# Patient Record
Sex: Male | Born: 1985 | Race: Black or African American | Hispanic: No | Marital: Single | Smoking: Current every day smoker
Health system: Southern US, Community
[De-identification: ages and names within clinical notes are randomized; demographics above are authoritative.]

## PROBLEM LIST (undated history)

## (undated) HISTORY — PX: OTHER SURGICAL HISTORY: SHX169

## (undated) HISTORY — PX: SHOULDER SURGERY: SHX246

---
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
## 2006-08-18 DIAGNOSIS — F980 Enuresis not due to a substance or known physiological condition: Secondary | ICD-10-CM

## 2006-08-18 DIAGNOSIS — J45909 Unspecified asthma, uncomplicated: Secondary | ICD-10-CM | POA: Insufficient documentation

## 2006-08-18 DIAGNOSIS — L2089 Other atopic dermatitis: Secondary | ICD-10-CM | POA: Insufficient documentation

## 2010-04-10 ENCOUNTER — Emergency Department (HOSPITAL_COMMUNITY): Admission: EM | Admit: 2010-04-10 | Discharge: 2010-04-10 | Payer: Self-pay | Admitting: Family Medicine

## 2010-07-27 ENCOUNTER — Inpatient Hospital Stay (INDEPENDENT_AMBULATORY_CARE_PROVIDER_SITE_OTHER)
Admission: RE | Admit: 2010-07-27 | Discharge: 2010-07-27 | Disposition: A | Discharge status: Home / Self Care | Payer: Self-pay | Source: Ambulatory Visit | Attending: Family Medicine | Admitting: Family Medicine

## 2010-07-27 DIAGNOSIS — R112 Nausea with vomiting, unspecified: Secondary | ICD-10-CM

## 2010-07-27 LAB — POCT I-STAT, CHEM 8
BUN: 16 mg/dL (ref 6–23)
Creatinine, Ser: 1.3 mg/dL (ref 0.4–1.5)
Glucose, Bld: 119 mg/dL — ABNORMAL HIGH (ref 70–99)
Hemoglobin: 16.7 g/dL (ref 13.0–17.0)

## 2010-07-27 LAB — POCT URINALYSIS DIPSTICK
Bilirubin Urine: NEGATIVE
Specific Gravity, Urine: 1.015 (ref 1.005–1.030)
Urine Glucose, Fasting: NEGATIVE mg/dL

## 2010-08-17 ENCOUNTER — Emergency Department (HOSPITAL_COMMUNITY)
Admission: EM | Admit: 2010-08-17 | Discharge: 2010-08-17 | Disposition: A | Discharge status: Home / Self Care | Payer: No Typology Code available for payment source | Attending: Emergency Medicine | Admitting: Emergency Medicine

## 2010-08-17 ENCOUNTER — Emergency Department (HOSPITAL_COMMUNITY): Payer: No Typology Code available for payment source

## 2010-08-17 DIAGNOSIS — M25569 Pain in unspecified knee: Secondary | ICD-10-CM | POA: Insufficient documentation

## 2010-08-17 DIAGNOSIS — J45909 Unspecified asthma, uncomplicated: Secondary | ICD-10-CM | POA: Insufficient documentation

## 2010-08-17 DIAGNOSIS — S8000XA Contusion of unspecified knee, initial encounter: Secondary | ICD-10-CM | POA: Insufficient documentation

## 2010-08-17 DIAGNOSIS — Y929 Unspecified place or not applicable: Secondary | ICD-10-CM | POA: Insufficient documentation
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
## 2011-06-17 ENCOUNTER — Encounter: Payer: Self-pay | Admitting: Emergency Medicine

## 2011-06-17 ENCOUNTER — Emergency Department (INDEPENDENT_AMBULATORY_CARE_PROVIDER_SITE_OTHER)
Admission: EM | Admit: 2011-06-17 | Discharge: 2011-06-17 | Disposition: A | Discharge status: Home / Self Care | Payer: 59 | Source: Home / Self Care | Attending: Emergency Medicine | Admitting: Emergency Medicine

## 2011-06-17 ENCOUNTER — Emergency Department (INDEPENDENT_AMBULATORY_CARE_PROVIDER_SITE_OTHER): Payer: No Typology Code available for payment source

## 2011-06-17 DIAGNOSIS — J111 Influenza due to unidentified influenza virus with other respiratory manifestations: Secondary | ICD-10-CM

## 2011-06-17 DIAGNOSIS — R6889 Other general symptoms and signs: Secondary | ICD-10-CM

## 2011-06-17 MED ORDER — ONDANSETRON 4 MG PO TBDP
8.0000 mg | ORAL_TABLET | Freq: Once | ORAL | Status: AC
Start: 2011-06-17 — End: 2011-06-17
  Administered 2011-06-17: 8 mg via ORAL

## 2011-06-17 MED ORDER — ONDANSETRON 4 MG PO TBDP
ORAL_TABLET | ORAL | Status: AC
  Filled 2011-06-17: qty 2

## 2011-06-17 MED ORDER — GUAIFENESIN-CODEINE 100-10 MG/5ML PO SYRP: 10.0000 mL | ORAL_SOLUTION | Freq: Four times a day (QID) | ORAL | Status: AC | PRN

## 2011-06-17 MED ORDER — TRAMADOL HCL 50 MG PO TABS: 100.0000 mg | ORAL_TABLET | Freq: Three times a day (TID) | ORAL | Status: AC | PRN

## 2011-06-17 MED ORDER — ONDANSETRON 8 MG PO TBDP: 8.0000 mg | ORAL_TABLET | Freq: Three times a day (TID) | ORAL | Status: AC | PRN

## 2011-06-17 NOTE — ED Notes (Signed)
Reports vomiting, abdominal pain .  Denies diarrhea.  Fever, unknown how high.  reports chills.  Patient reports he is holding down liquids, genral body aches.

## 2011-06-17 NOTE — ED Provider Notes (Signed)
History     CSN: [id number]  Arrival date & time 06/17/11  1300   First MD Initiated Contact with Patient 06/17/11 1552      Chief Complaint  Patient presents with  . Emesis    (Consider location/radiation/quality/duration/timing/severity/associated sxs/prior treatment) HPI Comments: Jack Floyd has had a three-day history of nausea and vomiting of solids, although he can keep liquids down. He's also felt feverish, had sweats, chills, generalized crampy abdominal pain, cough productive yellow sputum, sore throat, nasal congestion with bloody drainage, and epistaxis. He has not had the flu vaccine this year.  Patient is a 25 y.o. male presenting with vomiting.  Emesis  Associated symptoms include abdominal pain, chills, cough and a fever. Pertinent negatives include no diarrhea.    Past Medical History  Diagnosis Date  . Asthma     History reviewed. No pertinent past surgical history.  History reviewed. No pertinent family history.  History  Substance Use Topics  . Smoking status: Current Everyday Smoker  . Smokeless tobacco: Not on file  . Alcohol Use: No      Review of Systems  Constitutional: Positive for fever, chills, diaphoresis and fatigue.  HENT: Positive for congestion, sore throat and rhinorrhea. Negative for ear pain, sneezing, neck stiffness, voice change and postnasal drip.   Eyes: Negative for pain, discharge and redness.  Respiratory: Positive for cough and wheezing. Negative for chest tightness and shortness of breath.   Gastrointestinal: Positive for nausea, vomiting and abdominal pain. Negative for diarrhea.  Skin: Negative for rash.    Allergies  Review of patient's allergies indicates no known allergies.  Home Medications   Current Outpatient Rx  Name Route Sig Dispense Refill  . GUAIFENESIN ER 600 MG PO TB12 Oral Take 1,200 mg by mouth 2 (two) times daily.      . IBUPROFEN 200 MG PO TABS Oral Take 200 mg by mouth every 6 (six) hours as needed.       . GUAIFENESIN-CODEINE 100-10 MG/5ML PO SYRP Oral Take 10 mLs by mouth 4 (four) times daily as needed for cough. 120 mL 0  . ONDANSETRON 8 MG PO TBDP Oral Take 1 tablet (8 mg total) by mouth every 8 (eight) hours as needed for nausea. 20 tablet 0  . TRAMADOL HCL 50 MG PO TABS Oral Take 2 tablets (100 mg total) by mouth every 8 (eight) hours as needed for pain. Maximum dose= 8 tablets per day 30 tablet 0    BP 136/86  Pulse 100  Temp(Src) 100 F (37.8 C) (Oral)  Resp 20  SpO2 98%  Physical Exam  Nursing note and vitals reviewed. Constitutional: He appears well-developed and well-nourished. No distress.  HENT:  Head: Normocephalic and atraumatic.  Right Ear: External ear normal.  Left Ear: External ear normal.  Nose: Nose normal.  Mouth/Throat: Oropharynx is clear and moist. No oropharyngeal exudate.  Eyes: Conjunctivae and EOM are normal. Pupils are equal, round, and reactive to light. Right eye exhibits no discharge. Left eye exhibits no discharge.  Neck: Normal range of motion. Neck supple.  Cardiovascular: Normal rate, regular rhythm and normal heart sounds.   Pulmonary/Chest: Effort normal. No stridor. No respiratory distress. He has wheezes (he had scattered expiratory wheezes.). He has no rales. He exhibits no tenderness.  Abdominal: Soft. Bowel sounds are normal. He exhibits no distension and no mass. There is tenderness (he had mild generalized tenderness to palpation without guarding or rebound.). There is no rebound and no guarding.  Lymphadenopathy:  He has no cervical adenopathy.  Skin: Skin is warm and dry. No rash noted. He is not diaphoretic.    ED Course  Procedures (including critical care time)  Labs Reviewed - No data to display Dg Chest 2 View  06/17/2011  *RADIOLOGY REPORT*  Clinical Data: Cough, fever  CHEST - 2 VIEW  Comparison: 12/05/2003  Findings: Lungs are clear. No pleural effusion or pneumothorax.  Cardiomediastinal silhouette is within normal  limits.  Visualized osseous structures are within normal limits.  IMPRESSION: Normal chest radiographs.  Original Report Authenticated By: Charline Bills, M.D.     1. Influenza-like illness       MDM  He has an influenza-like illness and since this is been going on for more than 48 hours, will treat symptomatically with guaifenesin/codeine cough syrup, Zofran, and tramadol.        Roque Lias, MD 06/17/11 [phone]

## 2011-09-22 ENCOUNTER — Emergency Department (INDEPENDENT_AMBULATORY_CARE_PROVIDER_SITE_OTHER)
Admission: EM | Admit: 2011-09-22 | Discharge: 2011-09-22 | Disposition: A | Discharge status: Home / Self Care | Payer: 59 | Source: Home / Self Care | Attending: Emergency Medicine | Admitting: Emergency Medicine

## 2011-09-22 ENCOUNTER — Encounter (HOSPITAL_COMMUNITY): Payer: Self-pay | Admitting: *Deleted

## 2011-09-22 DIAGNOSIS — B9789 Other viral agents as the cause of diseases classified elsewhere: Secondary | ICD-10-CM

## 2011-09-22 DIAGNOSIS — B349 Viral infection, unspecified: Secondary | ICD-10-CM

## 2011-09-22 MED ORDER — GI COCKTAIL ~~LOC~~
30.0000 mL | Freq: Once | ORAL | Status: AC
  Administered 2011-09-22: 30 mL via ORAL

## 2011-09-22 MED ORDER — BENZONATATE 200 MG PO CAPS: 200.0000 mg | ORAL_CAPSULE | Freq: Three times a day (TID) | ORAL | Status: AC | PRN

## 2011-09-22 MED ORDER — DIPHENOXYLATE-ATROPINE 2.5-0.025 MG PO TABS: 1.0000 | ORAL_TABLET | Freq: Four times a day (QID) | ORAL | Status: AC | PRN

## 2011-09-22 MED ORDER — GI COCKTAIL ~~LOC~~
ORAL | Status: AC
  Filled 2011-09-22: qty 30

## 2011-09-22 MED ORDER — FLUTICASONE PROPIONATE 50 MCG/ACT NA SUSP: 2.0000 | Freq: Every day | NASAL | Status: DC

## 2011-09-22 NOTE — Discharge Instructions (Signed)
Most upper respiratory infections are caused by viruses and do not require antibiotics.  We try to save the antibiotics for when we really need them to avoid resistance.  This does not mean that there is nothing that can be done.  Here are a few hints about things that can be done at home to get over an upper respiratory infection quicker:  Get extra sleep and extra fluids.  Get 7 to 9 hours of sleep per night and 6 to 8 glasses of water a day.  Getting extra sleep keeps the immune system from getting run down.  Most people with an upper respiratory infection are a little dehydrated.  The extra fluids also keep the secretions liquified and easier to deal with.  Also, get extra vitamin C.  4000 mg per day is the recommended dose. For the aches, headache, and fever, acetaminophen or ibuprofen are helpful.  These can be alternated every 4 hours.  People with liver disease should avoid large amounts of acetaminophen, and people with ulcer disease, gastroesophageal reflux, gastritis, congestive heart failure, chronic kidney disease, coronary artery disease and the elderly should avoid ibuprofen. For nasal congestion try Mucinex-D, or if you're having lots of sneezing or copious clear nasal drainage Allegra-D-24 hour.  A Saline nasal spray such as Ocean Spray can also help as can decongestant sprays such as Afrin, but you should not use the decongestant sprays for more than 3 or 4 days since they can be habituating.  If nasal dryness is a problem, Ayr Nasal Gel can help moisturize your nasal passages.  Breath Rite nasal strips can also offer a non-drug alternative treatment to nasal congestion, especially at night. For people with symptoms of sinusitis, sleeping with your head elevated can be helpful.  For sinus pain, moist, hot compresses to the face may provide some relief.  Many people find that inhaling steam as in a shower or from a pot of steaming water can help. For sore throat, zinc containing lozenges such  as Cold-Eze or Zicam are helpful.  Zinc helps to fight infection and has a mild astringent effect that relieves the sore, achey throat.  Hot salt water gargles (8 oz of hot water, 1/2 tsp of table salt, and a pinch of baking soda) can give relief as well as hot beverages such as hot tea. For the cough, old time remedies such as honey or honey and lemon are tried and true.  Over the counter cough syrups such as Delsym 2 tsp every 12 hours can help as well.  It's important when you have an upper respiratory infection not to pass the infection to others.  This involves being very careful about the following:  Frequent hand washing or use of hand sanitizer, especially after coughing, sneezing, blowing your nose or touching your face, nose or eyes. Do not shake hands or touch anyone and try to avoid touching surfaces that other people use such as doorknobs, shopping carts, telephones and computer keyboards. Use tissues and dispose of them properly in a garbage can or ziplock bag. Cough into your sleeve. Do not let others eat or drink after you.  It's also important to recognize the signs of serious illness and get evaluated if they occur: Any respiratory infection that lasts more than 7 to 10 days.  Yellow nasal drainage and sputum are not reliable indicators of a bacterial infection, but if they last for more than 1 week, see your doctor. Fever and sore throat can indicate strep. Fever   and cough can indicate influenza or pneumonia. Any kind of severe symptom such as difficulty breathing, intractable vomiting, or severe pain should prompt you to see a doctor as soon as possible.   Your body's immune system is really the thing that will get rid of this infection.  Your immune system is comprised of 2 types of specialized cells called T cells and B cells.  T cells coordinate the array of cells in your body that engulf invading bacteria or viruses while B cells orchestrate the production of antibodies that  neutralize infection.  Anything we do or any medications we give you, will just strengthen your immune system or help it clear up the infection quicker.  Here are a few helpful hints to improve your immune system to help overcome this illness or to prevent future infections:  A few vitamins can improve the health of your immune system.  That's why your diet should include plenty of fruits, vegetables, fish, nuts, and whole grains.  Vitamin A and bet-carotene can increase the cells that fight infections (T cells and B cells).  Vitamin A is abundant in dark greens and orange vegetables such as spinach, greens, sweet potatoes, and carrots.  Vitamin B6 contributes to the maturation of white blood cells, the cells that fight disease.  Foods with vitamin B6 include cold cereal and bananas.  Vitamin C is credited with preventing colds because it increases white blood cells and also prevents cellular damage.  Citrus fruits, peaches and green and red bell peppers are all hight in vitamin C.  Vitamin E is an anti-oxidant that encourages the production of natural killer cells which reject foreign invaders and B cells that produce antibodies.  Foods high in vitamin E include wheat germ, nuts and seeds.  Foods high in omega-3 fatty acids found in foods like salmon, tuna and mackerel boost your immune system and help cells to engulf and absorb germs.  Probiotics are good bacteria that increase your T cells.  These can be found in yogurt and are available in supplements such as Culturelle or Align.  Moderate exercise increases the strength of your immune system and your ability to recover from illness.  I suggest 3 to 5 moderate intensity 30 minute workouts per week.    Sleep is another component of maintaining a strong immune system.  It enables your body to recuperate from the day's activities, stress and work.  My recommendation is to get between 7 and 9 hours of sleep per night.  If you smoke, try to quit  completely or at least cut down.  Drink alcohol only in moderation if at all.  No more than 2 drinks daily for men or 1 for women.  Get a flu vaccine early in the fall or if you have not gotten one yet, once this illness has run its course.  If you are over 65, a smoker, or an asthmatic, get a pneumococcal vaccine.  My final recommendation is to maintain a healthy weight.  Excess weight can impair the immune system by interfering with the way the immune system deals with invading viruses or bacteria.   You have been diagnosed with gastroenteritis.  This can be caused by a virus or a bacteria.  Viral infections can last from less than a day to a week.  If your symptoms last more than a week, a bacterial infection is more likely.  Either way, you must assume you are contagious and take infectious precautions.  If you work   in food preparation, you should stay out of work.  Likewise, you should not prepare food for your family.  Practice frequent hand washing.  Hand sanitizer does not reliably kill the virus.  Wash your hands after you use the bathroom, touch your mouth or face, and before contact with anyone.  Do not kiss anyone and do not let anyone eat or drink after you.  For right now, we recommend taking only clear liquids.  This would include things like Gator Aid or other sports drinks, tea, water, ice chips, clear juices, ginger ale, Seven-Up, Sprite, Pedialyte, jello, clear broth--anything you can see through and applesauce.  You should do this for at least 24 hours, perhaps longer.  We recommend small sips at a time.  Sometimes drinking a large amount will cause you to be nauseated and you will vomit it back up.  Sometimes it helps to have this chilled or drink it over ice chips.  Once your stomach settles down a little, you can advance to a very light diet.  We have a diet called the b.r.a.t. Diet which stands for the following:  Bananas  Rice  Apple sauce (not apple juice)  Toast or  crackers.  If diarrhea becomes a problem, you may try Imodeum unless your doctor tells you not to. You can take up to 4 per day or 1 every 6 hours.  Stick with this for about 24 hours, then you may advance to a more regular diet, but your stomach will be sensitive for 5 to 7 days, so it would be a good idea to avoid heavy, greasy, fried, or spicey foods.    You should return if:  You symptoms are not better in 3 days or they have gone on for 7 days total.  You have severe symptoms of high fever or severe abdominal pain.  You feel you are getting dehydrated with dizziness, weakness, muscle cramps, or severe fatigue.  You have blood in your vomitus or stool.  This includes black discoloration of your vomitus or stool.  But remember that Pepto Bismol can cause black stools.    

## 2011-09-22 NOTE — ED Notes (Signed)
Pt  Reports  He  Has  Had  Symptoms  Of   Nausea   Diarrhea  And  Stomach  Cramps  For  Several   Days   He  Reports  That  His  Nose  Has  Been  Stuffy  And   His  Mouth  Is  Dry     He  Is  Sitting upright on  Exam table  Speaking in complete  sentances       -

## 2011-09-22 NOTE — ED Provider Notes (Signed)
Chief Complaint  Patient presents with  . Diarrhea    History of Present Illness:   Jack Floyd is a 26 year old male who has had a three-day history of loose stools with about 3-4 loose, watery stools per day without blood or mucus. He's also had nasal congestion, sneezing, yellow drainage, aching in his ears, sinus pain, generalized abdominal pain, has felt feverish, has slight cough. He's had no nausea or vomiting. His appetite is poor. His mouth is dry. He denies any sore throat.  Review of Systems:  Other than noted above, the patient denies any of the following symptoms: Systemic:  No fevers, chills, sweats, weight loss or gain, fatigue, or tiredness. ENT:  No nasal congestion, rhinorrhea, or sore throat. Lungs:  No cough, wheezing, or shortness of breath. Cardiac:  No chest pain, syncope, or presyncope. GI:  No abdominal pain, nausea, vomiting, anorexia, diarrhea, constipation, blood in stool or vomitus. GU:  No dysuria, frequency, or urgency.  PMFSH:  Past medical history, family history, social history, meds, and allergies were reviewed.  Physical Exam:   Vital signs:  BP 118/84  Pulse 75  Temp(Src) 97.1 F (36.2 C) (Oral)  Resp 26  SpO2 97% General:  Alert and oriented.  In no distress.  Skin warm and dry.  Good skin turgor, brisk capillary refill. ENT:  No scleral icterus, moist mucous membranes, no oral lesions, pharynx clear. Lungs:  Breath sounds clear and equal bilaterally.  No wheezes, rales, or rhonchi. Heart:  Rhythm regular, without extrasystoles.  No gallops or murmers. Abdomen:  Abdomen was soft, flat, nondistended. There was no tenderness to palpation, guarding, or rebound. No organomegaly or mass. Bowel sounds were normally active. Skin: Clear, warm, and dry.  Good turgor.  Brisk capillary refill.   Course in Urgent Care Center:   He was given 30 mL of GI cocktail, but only was able to drink about half of it. He otherwise tolerated it well without any side effects or  reactions.  Assessment:  The encounter diagnosis was Viral syndrome.  Plan:   1.  The following meds were prescribed:   New Prescriptions   BENZONATATE (TESSALON) 200 MG CAPSULE    Take 1 capsule (200 mg total) by mouth 3 (three) times daily as needed for cough.   DIPHENOXYLATE-ATROPINE (LOMOTIL) 2.5-0.025 MG PER TABLET    Take 1 tablet by mouth 4 (four) times daily as needed for diarrhea or loose stools.   FLUTICASONE (FLONASE) 50 MCG/ACT NASAL SPRAY    Place 2 sprays into the nose daily.   2.  The patient was instructed in symptomatic care and handouts were given. 3.  The patient was told to return if becoming worse in any way, if no better in 2 or 3 days, and given some red flag symptoms that would indicate earlier return. 4.  The patient was told to take only sips of clear liquids for the next 24 hours and then advance to a b.r.a.t. Diet.      Reuben Likes, MD 09/22/11 [phone]

## 2012-10-20 ENCOUNTER — Encounter (HOSPITAL_COMMUNITY): Payer: Self-pay | Admitting: Emergency Medicine

## 2012-10-20 ENCOUNTER — Emergency Department (HOSPITAL_COMMUNITY)
Admission: EM | Admit: 2012-10-20 | Discharge: 2012-10-20 | Disposition: A | Discharge status: Home / Self Care | Payer: 59 | Source: Home / Self Care

## 2012-10-20 DIAGNOSIS — K029 Dental caries, unspecified: Secondary | ICD-10-CM

## 2012-10-20 MED ORDER — KETOROLAC TROMETHAMINE 10 MG PO TABS: 10.0000 mg | ORAL_TABLET | Freq: Four times a day (QID) | ORAL | Status: DC | PRN

## 2012-10-20 MED ORDER — CLINDAMYCIN HCL 150 MG PO CAPS: 150.0000 mg | ORAL_CAPSULE | Freq: Four times a day (QID) | ORAL | Status: DC

## 2012-10-20 NOTE — ED Notes (Signed)
Right, upper back tooth pain since January, pain has worsened.  Patient reports left ear is hurting -in November.

## 2012-10-20 NOTE — ED Provider Notes (Signed)
History     CSN: [id number]  Arrival date & time 10/20/12  1230   None     Chief Complaint  Patient presents with  . Dental Pain    (Consider location/radiation/quality/duration/timing/severity/associated sxs/prior treatment) Patient is a 27 y.o. male presenting with tooth pain. The history is provided by the patient.  Dental PainThe primary symptoms include mouth pain. The symptoms began more than 1 month ago (tooth ache since jan). The symptoms are chronic.  Additional symptoms include: dental sensitivity to temperature and ear pain. Additional symptoms do not include: facial swelling.    Past Medical History  Diagnosis Date  . Asthma     History reviewed. No pertinent past surgical history.  No family history on file.  History  Substance Use Topics  . Smoking status: Current Every Day Smoker  . Smokeless tobacco: Not on file  . Alcohol Use: No      Review of Systems  Constitutional: Negative.   HENT: Positive for ear pain and dental problem. Negative for facial swelling.     Allergies  Review of patient's allergies indicates no known allergies.  Home Medications   Current Outpatient Rx  Name  Route  Sig  Dispense  Refill  . Benzocaine (ORAL GEL ANESTHETIC MT)   Mouth/Throat   Use as directed in the mouth or throat.         Marland Kitchen EXPIRED: fluticasone (FLONASE) 50 MCG/ACT nasal spray   Nasal   Place 2 sprays into the nose daily.   16 g   0   . guaiFENesin (MUCINEX) 600 MG 12 hr tablet   Oral   Take 1,200 mg by mouth 2 (two) times daily.           Marland Kitchen ibuprofen (ADVIL,MOTRIN) 200 MG tablet   Oral   Take 200 mg by mouth every 6 (six) hours as needed.             BP 135/83  Pulse 62  Temp(Src) 98.1 F (36.7 C) (Oral)  Resp 18  SpO2 100%  Physical Exam  Nursing note and vitals reviewed. Constitutional: He appears well-developed and well-nourished.  HENT:  Head: Normocephalic.  Right Ear: External ear normal.  Left Ear: External ear  normal.  Mouth/Throat: Uvula is midline, oropharynx is clear and moist and mucous membranes are normal. Dental abscesses and dental caries present.      ED Course  Procedures (including critical care time)  Labs Reviewed - No data to display No results found.   No diagnosis found.    MDM          Linna Hoff, MD 10/20/12 [phone]

## 2012-10-28 ENCOUNTER — Encounter (HOSPITAL_COMMUNITY): Payer: Self-pay

## 2012-10-28 ENCOUNTER — Emergency Department (HOSPITAL_COMMUNITY)
Admission: EM | Admit: 2012-10-28 | Discharge: 2012-10-28 | Disposition: A | Discharge status: Home / Self Care | Payer: 59 | Attending: Emergency Medicine | Admitting: Emergency Medicine

## 2012-10-28 ENCOUNTER — Emergency Department (HOSPITAL_COMMUNITY): Payer: 59

## 2012-10-28 DIAGNOSIS — R630 Anorexia: Secondary | ICD-10-CM | POA: Insufficient documentation

## 2012-10-28 DIAGNOSIS — R0989 Other specified symptoms and signs involving the circulatory and respiratory systems: Secondary | ICD-10-CM | POA: Insufficient documentation

## 2012-10-28 DIAGNOSIS — F17200 Nicotine dependence, unspecified, uncomplicated: Secondary | ICD-10-CM | POA: Insufficient documentation

## 2012-10-28 DIAGNOSIS — B9789 Other viral agents as the cause of diseases classified elsewhere: Secondary | ICD-10-CM | POA: Insufficient documentation

## 2012-10-28 DIAGNOSIS — R05 Cough: Secondary | ICD-10-CM

## 2012-10-28 DIAGNOSIS — R0609 Other forms of dyspnea: Secondary | ICD-10-CM | POA: Insufficient documentation

## 2012-10-28 DIAGNOSIS — J45909 Unspecified asthma, uncomplicated: Secondary | ICD-10-CM | POA: Insufficient documentation

## 2012-10-28 DIAGNOSIS — R1110 Vomiting, unspecified: Secondary | ICD-10-CM | POA: Insufficient documentation

## 2012-10-28 DIAGNOSIS — B349 Viral infection, unspecified: Secondary | ICD-10-CM

## 2012-10-28 DIAGNOSIS — J3489 Other specified disorders of nose and nasal sinuses: Secondary | ICD-10-CM | POA: Insufficient documentation

## 2012-10-28 DIAGNOSIS — J029 Acute pharyngitis, unspecified: Secondary | ICD-10-CM | POA: Insufficient documentation

## 2012-10-28 DIAGNOSIS — R61 Generalized hyperhidrosis: Secondary | ICD-10-CM | POA: Insufficient documentation

## 2012-10-28 DIAGNOSIS — R6883 Chills (without fever): Secondary | ICD-10-CM | POA: Insufficient documentation

## 2012-10-28 DIAGNOSIS — R0600 Dyspnea, unspecified: Secondary | ICD-10-CM

## 2012-10-28 DIAGNOSIS — R059 Cough, unspecified: Secondary | ICD-10-CM | POA: Insufficient documentation

## 2012-10-28 DIAGNOSIS — R52 Pain, unspecified: Secondary | ICD-10-CM | POA: Insufficient documentation

## 2012-10-28 MED ORDER — PREDNISONE 10 MG PO TABS: 50.0000 mg | ORAL_TABLET | Freq: Every day | ORAL | Status: DC

## 2012-10-28 MED ORDER — PREDNISONE 20 MG PO TABS
60.0000 mg | ORAL_TABLET | Freq: Once | ORAL | Status: AC
  Administered 2012-10-28: 60 mg via ORAL
  Filled 2012-10-28: qty 3

## 2012-10-28 MED ORDER — ALBUTEROL SULFATE (5 MG/ML) 0.5% IN NEBU
2.5000 mg | INHALATION_SOLUTION | Freq: Once | RESPIRATORY_TRACT | Status: AC
  Administered 2012-10-28: 2.5 mg via RESPIRATORY_TRACT
  Filled 2012-10-28: qty 0.5

## 2012-10-28 MED ORDER — IPRATROPIUM BROMIDE 0.02 % IN SOLN
0.5000 mg | Freq: Once | RESPIRATORY_TRACT | Status: AC
  Administered 2012-10-28: 0.5 mg via RESPIRATORY_TRACT
  Filled 2012-10-28: qty 2.5

## 2012-10-28 MED ORDER — ALBUTEROL SULFATE HFA 108 (90 BASE) MCG/ACT IN AERS: 2.0000 | INHALATION_SPRAY | RESPIRATORY_TRACT | Status: DC | PRN

## 2012-10-28 NOTE — ED Notes (Signed)
Pt c/o all over body aches, nasal and chest congestion, decrease appetite, and hot flashes starting last night

## 2012-10-28 NOTE — ED Notes (Signed)
Patient states that he has been feeling bad for the past couple of days. +cough, body aches, chills, hot flashes

## 2012-10-28 NOTE — ED Provider Notes (Signed)
History     CSN: [id number]  Arrival date & time 10/28/12  8295   First MD Initiated Contact with Patient 10/28/12 0507      Chief Complaint  Patient presents with  . Nasal Congestion  . Cough    (Consider location/radiation/quality/duration/timing/severity/associated sxs/prior treatment) Patient is a 27 y.o. male presenting with cough. The history is provided by the patient.  Cough He has been sick for the last 2 days with nasal congestion with yellow rhinorrhea, cough productive of yellow sputum, dyspnea, generalized bodyaches. There's been a sore throat and appetite has been decreased. He has not had any fever but has had chills and sweats. He has had some episodes of post tussive emesis. He denies any sick contacts. Nothing makes symptoms better nothing makes them worse.  Past Medical History  Diagnosis Date  . Asthma     History reviewed. No pertinent past surgical history.  History reviewed. No pertinent family history.  History  Substance Use Topics  . Smoking status: Current Every Day Smoker  . Smokeless tobacco: Not on file  . Alcohol Use: No      Review of Systems  Respiratory: Positive for cough.   All other systems reviewed and are negative.    Allergies  Red dye  Home Medications   Current Outpatient Rx  Name  Route  Sig  Dispense  Refill  . naproxen sodium (ANAPROX) 220 MG tablet   Oral   Take 220 mg by mouth 2 (two) times daily as needed (for pain).         . clindamycin (CLEOCIN) 150 MG capsule   Oral   Take 1 capsule (150 mg total) by mouth 4 (four) times daily.   28 capsule   0   . ketorolac (TORADOL) 10 MG tablet   Oral   Take 1 tablet (10 mg total) by mouth every 6 (six) hours as needed for pain.   20 tablet   0     BP 142/90  Pulse 77  Temp(Src) 98.1 F (36.7 C) (Oral)  Resp 20  SpO2 95%  Physical Exam  Nursing note and vitals reviewed.  27 year old male, resting comfortably and in no acute distress. Vital signs are  significant for hypertension with blood pressure 142/90. Oxygen saturation is 95%, which is normal. Head is normocephalic and atraumatic. PERRLA, EOMI. Oropharynx is mildly erythematous without exudate. Neck is nontender and supple without adenopathy or JVD. Back is nontender and there is no CVA tenderness. Lungs are clear without rales, wheezes, or rhonchi. Chest is nontender. Heart has regular rate and rhythm without murmur. Abdomen is soft, flat, nontender without masses or hepatosplenomegaly and peristalsis is normoactive. Extremities have no cyanosis or edema, full range of motion is present. Skin is warm and dry without rash. Neurologic: Mental status is normal, cranial nerves are intact, there are no motor or sensory deficits.  ED Course  Procedures (including critical care time)  Results for orders placed during the hospital encounter of 10/28/12  RAPID STREP SCREEN      Result Value Range   Streptococcus, Group A Screen (Direct) NEGATIVE  NEGATIVE   Dg Chest 2 View  10/28/2012  *RADIOLOGY REPORT*  Clinical Data: Cough, congestion and fever.  CHEST - 2 VIEW  Comparison: PA and lateral chest 06/17/2011.  Findings: Lungs are clear.  Heart size is normal.  No pneumothorax or pleural fluid.  IMPRESSION: Negative chest.   Original Report Authenticated By: Holley Dexter, M.D.  1. Viral syndrome   2. Cough   3. Dyspnea       MDM  Symptom complex which seems most consistent with a viral syndrome. He'll be given a therapeutic trial of albuterol with Atrovent. Chest x-ray or be obtained to rule out pneumonia and strep screen will be obtained. He is given initial dose of prednisone.  Chest x-ray is negative for pneumonia and strep screen is negative. He got considerable subjective relief with albuterol with Atrovent. He is discharged with prescription for prednisone and an albuterol inhaler.      Dione Booze, MD 10/28/12 [phone]

## 2013-01-01 ENCOUNTER — Emergency Department (HOSPITAL_COMMUNITY)
Admission: EM | Admit: 2013-01-01 | Discharge: 2013-01-01 | Disposition: A | Discharge status: Home / Self Care | Payer: 59 | Attending: Emergency Medicine | Admitting: Emergency Medicine

## 2013-01-01 ENCOUNTER — Encounter (HOSPITAL_COMMUNITY): Payer: Self-pay | Admitting: *Deleted

## 2013-01-01 DIAGNOSIS — Z79899 Other long term (current) drug therapy: Secondary | ICD-10-CM | POA: Insufficient documentation

## 2013-01-01 DIAGNOSIS — H9190 Unspecified hearing loss, unspecified ear: Secondary | ICD-10-CM | POA: Insufficient documentation

## 2013-01-01 DIAGNOSIS — F17200 Nicotine dependence, unspecified, uncomplicated: Secondary | ICD-10-CM | POA: Insufficient documentation

## 2013-01-01 DIAGNOSIS — H60399 Other infective otitis externa, unspecified ear: Secondary | ICD-10-CM | POA: Insufficient documentation

## 2013-01-01 DIAGNOSIS — J45909 Unspecified asthma, uncomplicated: Secondary | ICD-10-CM | POA: Insufficient documentation

## 2013-01-01 DIAGNOSIS — H6092 Unspecified otitis externa, left ear: Secondary | ICD-10-CM

## 2013-01-01 MED ORDER — CIPROFLOXACIN-DEXAMETHASONE 0.3-0.1 % OT SUSP
4.0000 [drp] | Freq: Once | OTIC | Status: AC
  Administered 2013-01-01: 4 [drp] via OTIC
  Filled 2013-01-01 (×2): qty 7.5

## 2013-01-01 MED ORDER — HYDROCODONE-ACETAMINOPHEN 5-325 MG PO TABS: 1.0000 | ORAL_TABLET | Freq: Four times a day (QID) | ORAL | Status: DC | PRN

## 2013-01-01 MED ORDER — HYDROCODONE-ACETAMINOPHEN 5-325 MG PO TABS
2.0000 | ORAL_TABLET | Freq: Once | ORAL | Status: AC
  Administered 2013-01-01: 2 via ORAL
  Filled 2013-01-01: qty 2

## 2013-01-01 NOTE — ED Notes (Signed)
Pt started yesterday with left ear ache. Now has pain in TMJ area and states is unable to hear.

## 2013-01-01 NOTE — ED Provider Notes (Signed)
History    CSN: [id number] Arrival date & time 01/01/13  1048  First MD Initiated Contact with Patient 01/01/13 1109     Chief Complaint  Patient presents with  . Otalgia   (Consider location/radiation/quality/duration/timing/severity/associated sxs/prior Treatment) Patient is a 27 y.o. male presenting with ear pain. The history is provided by the patient. No language interpreter was used.  Otalgia Location:  Left Behind ear:  Swelling Quality:  Throbbing Severity:  Moderate Onset quality:  Gradual Duration:  3 days Timing:  Constant Progression:  Worsening Chronicity:  New Context: not direct blow and not foreign body in ear   Relieved by: vicodin. Worsened by:  Cold air and swallowing Associated symptoms: hearing loss   Associated symptoms: no cough, no ear discharge, no fever, no neck pain, no sore throat and no tinnitus   Risk factors: no recent travel, no chronic ear infection and no prior ear surgery    Past Medical History  Diagnosis Date  . Asthma    History reviewed. No pertinent past surgical history. No family history on file. History  Substance Use Topics  . Smoking status: Current Every Day Smoker  . Smokeless tobacco: Not on file  . Alcohol Use: No    Review of Systems  Constitutional: Negative for fever.  HENT: Positive for hearing loss and ear pain. Negative for sore throat, neck pain, tinnitus and ear discharge.   Respiratory: Negative for cough.   All other systems reviewed and are negative.    Allergies  Red dye  Home Medications   Current Outpatient Rx  Name  Route  Sig  Dispense  Refill  . albuterol (PROVENTIL HFA;VENTOLIN HFA) 108 (90 BASE) MCG/ACT inhaler   Inhalation   Inhale 2 puffs into the lungs every 4 (four) hours as needed for wheezing or shortness of breath (or coughing).   1 Inhaler   0   . HYDROcodone-acetaminophen (NORCO/VICODIN) 5-325 MG per tablet   Oral   Take 1 tablet by mouth every 6 (six) hours as needed for  pain.   10 tablet   0    BP 144/93  Pulse 70  Temp(Src) 98.7 F (37.1 C) (Oral)  Resp 20  Ht 5\' 11"  (1.803 m)  Wt 220 lb (99.791 kg)  BMI 30.7 kg/m2  SpO2 99%  Physical Exam  Nursing note and vitals reviewed. Constitutional: He is oriented to person, place, and time. He appears well-developed and well-nourished. No distress.  HENT:  Head: Normocephalic and atraumatic.  Right Ear: Tympanic membrane, external ear and ear canal normal. No mastoid tenderness.  Left Ear: There is tenderness. No mastoid tenderness.  Mouth/Throat: Oropharynx is clear and moist. No oropharyngeal exudate.  Canal swollen and erythematous; unable to visualize TM. TTP of tragus and when pulling on auricle. +cervical lymphadenopathy (retroauricular)  Eyes: EOM are normal. Pupils are equal, round, and reactive to light. No scleral icterus.  Neck: Normal range of motion. Neck supple.  Cardiovascular: Normal rate, regular rhythm and intact distal pulses.   Pulmonary/Chest: Effort normal. No stridor. No respiratory distress.  Musculoskeletal: Normal range of motion.  Lymphadenopathy:    He has cervical adenopathy.  Neurological: He is alert and oriented to person, place, and time.  Skin: Skin is warm and dry. No rash noted. He is not diaphoretic. No erythema. No pallor.  Psychiatric: He has a normal mood and affect. His behavior is normal.    ED Course  Procedures (including critical care time) Labs Reviewed - No data to display  No results found.  1. Otitis externa of left ear    MDM  Uncomplicated otitis externa - Patient afebrile and well and nontoxic appearing. Physical exam findings as above. Ear wick applied in ED as well as 4 drops of Ciprodex otic. Patient appropriate for d/c with Ciprodex 4gtt BID x 7 days. Short course of Norco provided for severe pain. Patient instructed to return in 1 week for ear wick removal or sooner if symptoms worsen. Patient verbalizes comfort and understanding with this  d/c plan with no unaddressed concerns.  Antony Madura, PA-C 01/01/13 [phone]

## 2013-01-01 NOTE — ED Notes (Signed)
Pt has left ear pain, cannot hear out of ear, and has swelling left face

## 2013-01-02 ENCOUNTER — Emergency Department (HOSPITAL_COMMUNITY)
Admission: EM | Admit: 2013-01-02 | Discharge: 2013-01-02 | Disposition: A | Discharge status: Home / Self Care | Payer: 59 | Attending: Emergency Medicine | Admitting: Emergency Medicine

## 2013-01-02 ENCOUNTER — Encounter (HOSPITAL_COMMUNITY): Payer: Self-pay | Admitting: *Deleted

## 2013-01-02 DIAGNOSIS — J45909 Unspecified asthma, uncomplicated: Secondary | ICD-10-CM | POA: Insufficient documentation

## 2013-01-02 DIAGNOSIS — F17200 Nicotine dependence, unspecified, uncomplicated: Secondary | ICD-10-CM | POA: Insufficient documentation

## 2013-01-02 DIAGNOSIS — H6092 Unspecified otitis externa, left ear: Secondary | ICD-10-CM

## 2013-01-02 DIAGNOSIS — Z79899 Other long term (current) drug therapy: Secondary | ICD-10-CM | POA: Insufficient documentation

## 2013-01-02 DIAGNOSIS — R51 Headache: Secondary | ICD-10-CM | POA: Insufficient documentation

## 2013-01-02 DIAGNOSIS — H60399 Other infective otitis externa, unspecified ear: Secondary | ICD-10-CM | POA: Insufficient documentation

## 2013-01-02 MED ORDER — ANTIPYRINE-BENZOCAINE 5.4-1.4 % OT SOLN
3.0000 [drp] | Freq: Once | OTIC | Status: AC
  Administered 2013-01-02: 4 [drp] via OTIC
  Filled 2013-01-02: qty 10

## 2013-01-02 MED ORDER — CIPROFLOXACIN-DEXAMETHASONE 0.3-0.1 % OT SUSP
4.0000 [drp] | Freq: Once | OTIC | Status: DC
  Filled 2013-01-02: qty 7.5

## 2013-01-02 MED ORDER — ONDANSETRON 4 MG PO TBDP
8.0000 mg | ORAL_TABLET | Freq: Once | ORAL | Status: AC
Start: 2013-01-02 — End: 2013-01-02
  Administered 2013-01-02: 8 mg via ORAL
  Filled 2013-01-02: qty 2

## 2013-01-02 MED ORDER — OXYCODONE-ACETAMINOPHEN 5-325 MG PO TABS
2.0000 | ORAL_TABLET | Freq: Once | ORAL | Status: AC
  Administered 2013-01-02: 2 via ORAL
  Filled 2013-01-02: qty 2

## 2013-01-02 NOTE — ED Provider Notes (Signed)
History    CSN: [id number] Arrival date & time 01/02/13  1256  First MD Initiated Contact with Patient 01/02/13 1459     Chief Complaint  Patient presents with  . Otalgia   (Consider location/radiation/quality/duration/timing/severity/associated sxs/prior Treatment) HPI Comments: Patient is a 27 year old male who presents today with left ear pain. It has been progressively worsening for the past 3 days. He was seen yesterday and diagnosed with otitis externa. He has been taking Vicodin with no relief. He's been using Cipro otic drops. He feels as though the nurse was able to successfully get them into his ear yesterday, but he has not been able to get them into his ear since he's been home. His girlfriend has been helping him try to get him into his ear. He has a headache. No fevers, chills, nausea, vomiting, abdominal pain.  Patient is a 27 y.o. male presenting with ear pain. The history is provided by the patient. No language interpreter was used.  Otalgia Associated symptoms: headaches   Associated symptoms: no abdominal pain, no fever and no vomiting    Past Medical History  Diagnosis Date  . Asthma    History reviewed. No pertinent past surgical history. History reviewed. No pertinent family history. History  Substance Use Topics  . Smoking status: Current Every Day Smoker  . Smokeless tobacco: Not on file  . Alcohol Use: No    Review of Systems  Constitutional: Negative for fever and chills.  HENT: Positive for ear pain.   Gastrointestinal: Negative for nausea, vomiting and abdominal pain.  Neurological: Positive for headaches.  All other systems reviewed and are negative.    Allergies  Red dye  Home Medications   Current Outpatient Rx  Name  Route  Sig  Dispense  Refill  . HYDROcodone-acetaminophen (NORCO/VICODIN) 5-325 MG per tablet   Oral   Take 1 tablet by mouth every 6 (six) hours as needed for pain.   10 tablet   0   . albuterol (PROVENTIL  HFA;VENTOLIN HFA) 108 (90 BASE) MCG/ACT inhaler   Inhalation   Inhale 2 puffs into the lungs every 4 (four) hours as needed for wheezing or shortness of breath (or coughing).   1 Inhaler   0    BP 152/90  Pulse 62  Temp(Src) 98.7 F (37.1 C) (Oral)  Resp 16  SpO2 98% Physical Exam  Nursing note and vitals reviewed. Constitutional: He is oriented to person, place, and time. He appears well-developed and well-nourished. No distress.  HENT:  Head: Normocephalic and atraumatic.  Right Ear: External ear normal.  Left Ear: There is tenderness. No mastoid tenderness. Tympanic membrane is not erythematous.  Nose: Nose normal.  Canal edema in left ear. Visualized small amount of TM after ear wick was removed. TM in not injected.  TTP over left tragus.  Eyes: Conjunctivae are normal.  Neck: Normal range of motion. No tracheal deviation present.  Cardiovascular: Normal rate, regular rhythm and normal heart sounds.   Pulmonary/Chest: Effort normal and breath sounds normal. No stridor.  Abdominal: Soft. He exhibits no distension. There is no tenderness.  Musculoskeletal: Normal range of motion.  Neurological: He is alert and oriented to person, place, and time.  Skin: Skin is warm and dry. He is not diaphoretic.  Psychiatric: He has a normal mood and affect. His behavior is normal.    ED Course  Procedures (including critical care time) Labs Reviewed - No data to display No results found. 1. Otitis externa, left  MDM  Pt presenting with otitis externa. Seen in ED yesterday for the same. Ear wick replaced today. Improvement after antipyrine-benzocaine drops. No canal occlusion, Pt afebrile. Exam non concerning for mastoiditis, cellulitis or malignant OE. Patient has cipro ear drops at home. Advised follow up in 2-3 days if no improvement with treatment or no complete resolution by 7 days. Earlier f-u if patient develops rash , allergic reaction to medication, or loss of  hearing.   Jack Bellman, PA-C 01/03/13 1909

## 2013-01-02 NOTE — ED Notes (Signed)
Reports being seen yesterday for same but having increase in pain to left ear.

## 2013-01-02 NOTE — ED Notes (Signed)
Patient states his ear "didn't get better" from where he was seen yesterday for the same thing.

## 2013-01-02 NOTE — ED Provider Notes (Signed)
Medical screening examination/treatment/procedure(s) were performed by non-physician practitioner and as supervising physician I was immediately available for consultation/collaboration.  Ashby Dawes, MD 01/02/13 [phone]

## 2013-01-04 NOTE — ED Provider Notes (Signed)
Medical screening examination/treatment/procedure(s) were performed by non-physician practitioner and as supervising physician I was immediately available for consultation/collaboration.  Toy Baker, MD 01/04/13 [phone]

## 2013-05-10 ENCOUNTER — Emergency Department (HOSPITAL_COMMUNITY): Payer: No Typology Code available for payment source

## 2013-05-10 ENCOUNTER — Emergency Department (HOSPITAL_COMMUNITY)
Admission: EM | Admit: 2013-05-10 | Discharge: 2013-05-10 | Disposition: A | Discharge status: Home / Self Care | Payer: No Typology Code available for payment source | Attending: Emergency Medicine | Admitting: Emergency Medicine

## 2013-05-10 ENCOUNTER — Encounter (HOSPITAL_COMMUNITY): Payer: Self-pay | Admitting: Emergency Medicine

## 2013-05-10 DIAGNOSIS — Y92410 Unspecified street and highway as the place of occurrence of the external cause: Secondary | ICD-10-CM | POA: Insufficient documentation

## 2013-05-10 DIAGNOSIS — J45909 Unspecified asthma, uncomplicated: Secondary | ICD-10-CM | POA: Insufficient documentation

## 2013-05-10 DIAGNOSIS — IMO0002 Reserved for concepts with insufficient information to code with codable children: Secondary | ICD-10-CM | POA: Insufficient documentation

## 2013-05-10 DIAGNOSIS — S8990XA Unspecified injury of unspecified lower leg, initial encounter: Secondary | ICD-10-CM | POA: Insufficient documentation

## 2013-05-10 DIAGNOSIS — Z79899 Other long term (current) drug therapy: Secondary | ICD-10-CM | POA: Insufficient documentation

## 2013-05-10 DIAGNOSIS — F17200 Nicotine dependence, unspecified, uncomplicated: Secondary | ICD-10-CM | POA: Insufficient documentation

## 2013-05-10 DIAGNOSIS — Y9389 Activity, other specified: Secondary | ICD-10-CM | POA: Insufficient documentation

## 2013-05-10 MED ORDER — HYDROCODONE-ACETAMINOPHEN 5-325 MG PO TABS: 1.0000 | ORAL_TABLET | Freq: Four times a day (QID) | ORAL | Status: DC | PRN

## 2013-05-10 MED ORDER — NAPROXEN 500 MG PO TABS: 500.0000 mg | ORAL_TABLET | Freq: Two times a day (BID) | ORAL | Status: DC
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
## 2013-05-14 NOTE — ED Provider Notes (Signed)
Medical screening examination/treatment/procedure(s) were performed by non-physician practitioner and as supervising physician I was immediately available for consultation/collaboration.  EKG Interpretation   None         Kaulder Zahner M Dereon Corkery, MD 05/14/13 0753 

## 2013-05-24 ENCOUNTER — Emergency Department (HOSPITAL_COMMUNITY)
Admission: EM | Admit: 2013-05-24 | Discharge: 2013-05-24 | Disposition: A | Discharge status: Home / Self Care | Payer: No Typology Code available for payment source | Attending: Emergency Medicine | Admitting: Emergency Medicine

## 2013-05-24 ENCOUNTER — Encounter (HOSPITAL_COMMUNITY): Payer: Self-pay | Admitting: Emergency Medicine

## 2013-05-24 DIAGNOSIS — R209 Unspecified disturbances of skin sensation: Secondary | ICD-10-CM | POA: Insufficient documentation

## 2013-05-24 DIAGNOSIS — M5412 Radiculopathy, cervical region: Secondary | ICD-10-CM

## 2013-05-24 DIAGNOSIS — G8911 Acute pain due to trauma: Secondary | ICD-10-CM | POA: Insufficient documentation

## 2013-05-24 DIAGNOSIS — J45909 Unspecified asthma, uncomplicated: Secondary | ICD-10-CM | POA: Insufficient documentation

## 2013-05-24 DIAGNOSIS — Z87828 Personal history of other (healed) physical injury and trauma: Secondary | ICD-10-CM | POA: Insufficient documentation

## 2013-05-24 MED ORDER — METHOCARBAMOL 500 MG PO TABS
750.0000 mg | ORAL_TABLET | Freq: Once | ORAL | Status: AC
  Administered 2013-05-24: 750 mg via ORAL
  Filled 2013-05-24: qty 2

## 2013-05-24 MED ORDER — PREDNISONE 50 MG PO TABS: ORAL_TABLET | ORAL | Status: DC

## 2013-05-24 MED ORDER — METHOCARBAMOL 750 MG PO TABS: 750.0000 mg | ORAL_TABLET | Freq: Four times a day (QID) | ORAL | Status: DC | PRN

## 2013-05-24 MED ORDER — PREDNISONE 20 MG PO TABS
60.0000 mg | ORAL_TABLET | Freq: Once | ORAL | Status: AC
  Administered 2013-05-24: 60 mg via ORAL
  Filled 2013-05-24: qty 3
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
## 2013-05-24 NOTE — ED Notes (Signed)
Pt states pain in his hands for one week, and states he has been evaluated in this ER for the same complaint, but states his pain is getting worse.  States prescribed medications as not helping.

## 2013-05-28 NOTE — ED Provider Notes (Signed)
Medical screening examination/treatment/procedure(s) were performed by non-physician practitioner and as supervising physician I was immediately available for consultation/collaboration.  EKG Interpretation   None        Jonie Burdell, MD 05/28/13 0909 

## 2013-08-21 ENCOUNTER — Other Ambulatory Visit: Payer: Self-pay | Admitting: Physician Assistant

## 2013-09-09 ENCOUNTER — Emergency Department (HOSPITAL_COMMUNITY)
Admission: EM | Admit: 2013-09-09 | Discharge: 2013-09-09 | Disposition: A | Discharge status: Home / Self Care | Payer: No Typology Code available for payment source | Attending: Emergency Medicine | Admitting: Emergency Medicine

## 2013-09-09 ENCOUNTER — Encounter (HOSPITAL_COMMUNITY): Payer: Self-pay | Admitting: Emergency Medicine

## 2013-09-09 DIAGNOSIS — L539 Erythematous condition, unspecified: Secondary | ICD-10-CM | POA: Insufficient documentation

## 2013-09-09 DIAGNOSIS — H9209 Otalgia, unspecified ear: Secondary | ICD-10-CM | POA: Insufficient documentation

## 2013-09-09 DIAGNOSIS — H9201 Otalgia, right ear: Secondary | ICD-10-CM

## 2013-09-09 DIAGNOSIS — H9210 Otorrhea, unspecified ear: Secondary | ICD-10-CM | POA: Insufficient documentation

## 2013-09-09 DIAGNOSIS — J45909 Unspecified asthma, uncomplicated: Secondary | ICD-10-CM | POA: Insufficient documentation

## 2013-09-09 DIAGNOSIS — F17200 Nicotine dependence, unspecified, uncomplicated: Secondary | ICD-10-CM | POA: Insufficient documentation

## 2013-09-09 MED ORDER — AMOXICILLIN 500 MG PO CAPS: 500.0000 mg | ORAL_CAPSULE | Freq: Two times a day (BID) | ORAL | Status: DC

## 2013-09-09 NOTE — Discharge Instructions (Signed)
Continue to use ear drops at home and follow-up with your doctor for further instructions Take oral antibiotics for full dose  Follow-up with ENT or primary doctor  Return to the emergency department if you develop any changing/worsening condition, increased drainage, fever, difficulty opening/closing your jaw, decreased hearing, or any other concerns (please read additional information regarding your condition below)     Draining Ear Fluid (drainage) can come from your ear. This may be wax, yellowish-white fluid (pus), blood, or other fluids. An infection, injury, or irritation may cause fluid to drain from your ear.  HOME CARE  Only take medicine as told by your doctor. This may include ear drops.  Do not rub inside your ear with cotton-tipped swabs.  Do not swim until your doctor says it is okay.  Before you take a shower, cover a cotton ball with petroleum jelly. Put it in your ear. This will keep water out.  Stay away from smoke.  Make sure your shots (vaccinations) are up to date.  Wash your hands well.  Keep all doctor visits as told. GET HELP RIGHT AWAY IF:   You have very bad ear pain or a headache.  You have a fever.  The patient is older than 3 months with a rectal temperature of 102 F (38.9 C) or higher.  The patient is 31 months old or younger with a rectal temperature of 100.4 F (38 C) or higher.  You throw up (vomit).  You feel dizzy.  You have twitching or shaking (seizure).  You have new hearing loss.  You have more fluid coming from the ear.  You have pain, a fever, or fluid drainage that does not get better within 48 hours of taking medine.  You are more tired than normal. MAKE SURE YOU:   Understand these instructions.  Will watch your condition.  Will get help right away if you are not doing well or get worse. Document Released: 11/25/2009 Document Revised: 08/30/2011 Document Reviewed: 11/25/2009 [facility] Patient Information 2014  [place], [place].  Otitis Media, Adult Otitis media is redness, soreness, and swelling (inflammation) of the middle ear. Otitis media may be caused by allergies or, most commonly, by infection. Often it occurs as a complication of the common cold. SIGNS AND SYMPTOMS Symptoms of otitis media may include:  Earache.  Fever.  Ringing in your ear.  Headache.  Leakage of fluid from the ear. DIAGNOSIS To diagnose otitis media, your health care provider will examine your ear with an otoscope. This is an instrument that allows your health care provider to see into your ear in order to examine your eardrum. Your health care provider also will ask you questions about your symptoms. TREATMENT  Typically, otitis media resolves on its own within 3 5 days. Your health care provider may prescribe medicine to ease your symptoms of pain. If otitis media does not resolve within 5 days or is recurrent, your health care provider may prescribe antibiotic medicines if he or she suspects that a bacterial infection is the cause. HOME CARE INSTRUCTIONS   Take your medicine as directed until it is gone, even if you feel better after the first few days.  Only take over-the-counter or prescription medicines for pain, discomfort, or fever as directed by your health care provider.  Follow up with your health care provider as directed. SEEK MEDICAL CARE IF:  You have otitis media only in one ear or bleeding from your nose or both.  You notice a lump on your  neck.  You are not getting better in 3 5 days.  You feel worse instead of better. SEEK IMMEDIATE MEDICAL CARE IF:   You have pain that is not controlled with medicine.  You have swelling, redness, or pain around your ear or stiffness in your neck.  You notice that part of your face is paralyzed.  You notice that the bone behind your ear (mastoid) is tender when you touch it. MAKE SURE YOU:   Understand these instructions.  Will watch your  condition.  Will get help right away if you are not doing well or get worse. Document Released: 03/12/2004 Document Revised: 03/28/2013 Document Reviewed: 01/02/2013 [facility] Patient Information 2014 [place], [place].   Emergency Department Resource Guide 1) Find a Doctor and Pay Out of Pocket Although you won't have to find out who is covered by your insurance plan, it is a good idea to ask around and get recommendations. You will then need to call the office and see if the doctor you have chosen will accept you as a new patient and what types of options they offer for patients who are self-pay. Some doctors offer discounts or will set up payment plans for their patients who do not have insurance, but you will need to ask so you aren't surprised when you get to your appointment.  2) Contact Your Local Health Department Not all health departments have doctors that can see patients for sick visits, but many do, so it is worth a call to see if yours does. If you don't know where your local health department is, you can check in your phone book. The CDC also has a tool to help you locate your state's health department, and many state websites also have listings of all of their local health departments.  3) Find a Walk-in Clinic If your illness is not likely to be very severe or complicated, you may want to try a walk in clinic. These are popping up all over the country in pharmacies, drugstores, and shopping centers. They're usually staffed by nurse practitioners or physician assistants that have been trained to treat common illnesses and complaints. They're usually fairly quick and inexpensive. However, if you have serious medical issues or chronic medical problems, these are probably not your best option.  No Primary Care Doctor: - Call Health Connect at  [phone] - they can help you locate a primary care doctor that  accepts your insurance, provides certain services, etc. - Physician Referral  Service- [phone]  Chronic Pain Problems: Organization         Address  Phone   Notes  [facility]  [phone] Patients need to be referred by their primary care doctor.   Medication Assistance: Organization         Address  Phone   Notes  [facility] Medication [facility] [address] [place] [place], [place] [address] [phone] --Must be a resident of [facility] -- Must have NO insurance coverage whatsoever (no Medicaid/ Medicare, etc.) -- The pt. MUST have a primary care doctor that directs their care regularly and follows them in the community   [facility]ist  [phone]   [employer]  [phone]    Agencies that provide inexpensive medical care: Organization         Address  Phone   Notes  [facility]  [phone]   [facility]    [phone]   [facility]  Outpatient Clinic [address] [place], [place] [id number] [phone]) [phone]   [facility] [address], [place] [phone]) [phone]   [facility]    [phone]) [phone]   [facility]    [phone](336) [phone]   [facility]  [address], [place] Phone:  [phone](336) [phone], Fax:  [phone] Hours of Operation:  9 am - 6 pm, M-F.  Also accepts Medicaid/Medicare and self-pay.  [facility]  [address], [place] Phone: [phone]) [phone], Fax: [phone]) [phone]. Hours of Operation:  8:30 am - 5:30 pm, M-F.  Also accepts Medicaid and self-pay.  [facility] [place] [address], [place] Phone: [phone](336) [phone]   [facility] [address], [place], [place] [phone], Ext. [phone] Mondays & Thursdays: 7-9 AM.  First 15 patients are seen on a first come, first serve basis.    Medicaid-accepting [facility] Providers:  Organization         Address  Phone   Notes  [facility] [address], [place] [phone]) [phone] Also accepts self-pay patients.  [facility] [address], [place]  [phone](336) [phone]   [facility] [address], [place] [phone]) [phone]   [facility] [address] [place] [address], [place] [phone]) [phone]   Renaye RakersVeita Bland [address], [place]   [phone]) [phone] Only accepts [place] Access [place] patients after they have their name applied to their card.   Self-Pay (no insurance) in [facility]:  Organization         Address  Phone   Notes  Sickle Cell Patients, [facility] [address], [place] [phone]) [phone]   [facility] [address], [place] [phone]   [facility] [place]  [address], [place] [phone]) [phone]   [facility]/Dr. Osei-Bonsu  [address] [place] [address], [place] or [phone], [place] [phone] Phone number for both [place] and [place] locations is the same.  [facility] [address], [place] [phone](336) [phone]   [facility]  [address] [place] [address], [place] or [address] [phone](336) [phone]   [facility] [address], [place] [phone](336) [phone], phone; [phone]) [phone], fax Sees patients 1st and 3rd Saturday of every month.  Must not qualify for public or private insurance (i.e. Medicaid, Medicare, [place] Health Choice, Veterans' Benefits)  Household income should be no more than 200% of the poverty level The clinic cannot treat you if you are pregnant or think you are pregnant  Sexually transmitted diseases are not treated at the clinic.    Dental Care: Organization         Address  Phone  Notes  [facility] of [facility] [address], [place] [phone]) [phone] Accepts children up to age 27 who are enrolled in  [place] or [place] Health Choice; pregnant women with a Medicaid card; and children who have applied for Medicaid or  Health Choice, but were declined, whose parents can pay a reduced fee at time of service.  [facility] of [facility] [place]  [address], [place] [phone]) [phone] Accepts children up to age 27 who are enrolled  in Medicaid or [place] Health Choice; pregnant women with a Medicaid card; and children who have applied for Medicaid or [place] Health Choice, but were declined, whose parents can pay a reduced fee at time of service.  [facility]  [address] [place], [place] [phone] Patients are seen by appointment only. Walk-ins are not accepted. [facility] will see patients 24 years of age and older. Monday - Tuesday (8am-5pm) Most Wednesdays (8:30-5pm) $30 per visit, cash only  [facility] Adult Dental Access PROGRAM  [address], [facility] [phone] Patients are seen by appointment only. Walk-ins are not accepted. [facility] will see patients 17 years of age and older. One Wednesday Evening (Monthly: Volunteer Based).  $30 per visit, cash only  [employer]  [phone] for adults; Children under age 25, call Graduate Pediatric Dentistry at [phone]. Children aged 22-14, please call [phone] to request a pediatric application.  Dental services are provided in all areas of dental care including fillings, crowns and bridges, complete and partial dentures, implants, gum treatment, root canals, and extractions. Preventive care is also provided. Treatment is provided to both adults and children. Patients are selected via a lottery and there is often a waiting list.   [facility] [address], [place]  [phone] [email or web address]   [facility] [address] [place], [place] [phone], Ext. [phone] Second and Fourth Thursday of each month, opens at 6:30  AM; Clinic ends at 9 AM.  Patients are seen on a first-come first-served basis, and a limited number are seen during each clinic.   [facility]  [address] [place], [place] [phone]   Eligibility Requirements You must have lived in [place], [place], or [place] counties for at least the last three months.   You cannot be eligible for state or federal sponsored [place], including [employer], [place], or [employer].   You generally cannot be eligible for healthcare insurance through your employer.    How to apply: Eligibility screenings are held every Tuesday and Wednesday afternoon from 1:00 pm until 4:00 pm. You do not need an appointment for the interview!  [facility] [address] [place], [place] [phone]   [facility]  [phone]   [facility] Health Department  [phone]   [facility] Health Department  [phone]    Behavioral Health Resources in the Community: Intensive Outpatient Programs Organization         Address  Phone  Notes  [facility] [address], [place], [place] [phone]   [facility] [address], [place], [place] [phone]   [facility] [address], [place], [place]  [phone]   [facility] Mental Health [address]  [place], [place] [phone] or [phone]   Substance Abuse Resources Organization         Address  Phone  Notes  [facility] Drug [facility]  [phone]   [facility]  [phone]   [facility] [place]  [phone]   Floydene Flock  [phone]   Residential & Outpatient Substance Abuse Program  [phone]   Psychological Services Organization         Address  Phone  Notes  [facility] Behavioral Health  [phone]   [facility]  [phone]   [facility] Mental Health [address], [place] [phone] or  [phone]    Mobile Crisis Teams Organization         Address  Phone  Notes  [facility], Mobile Crisis Care Unit  [phone]   [facility]  [address] [place], [place] [phone]   [facility] [address] [place] [phone]    Self-Help/Support Groups Organization         Address  Phone             Notes  [facility] [place] - variety of support groups  [phone]- I7437963 Call for more information  Narcotics Anonymous (NA), [facility] [address], [employer] [place]  2 meetings at this location   Statistician         Address  Phone  Notes  [facility] [address],    [place]  [phone]   [facility]  [address], [place] [address], [place], [place] [phone]   [facility] Treatment Facility [address] [place], [place] [phone] Admissions: 8am-3pm M-F  [facility] [address]    [place], [place] [phone]   [facility] [address] [place], [place], [place] [phone]   [facility] [address],  [place], [place] [phone]   [facility] - Intensive Outpatient [address], [place], [place] [phone]   [facility] ([facility] [address] [place]  [place], [place] [phone] or [phone]   [facility] ([facility]) [address] [place], [place] [phone] Accepts Medicaid  [facility] [address]  [place] [phone] Substance Abuse/Addiction Treatment   [facility] Organization         Address  Phone  Notes  [facility]  [phone]   Angie Fava, PhD [address]vin Knack [place], [place]   (912)[phone] or [phone]   [facility]   [address] [place], [place] 774-843-0249   [facility] [address],  [place], [place] [phone] Insurance/Medicaid/sponsorship through [facility] Families [address]                                    [place], [place] [phone] Therapy/tele-psych/case  [facility] [address], [place] [phone]    Dr. Lolly Mustache  [phone]   [facility]  [employer] [facility] Dept. 1) [address], [place] 2) [address], [place] 3)  [address], [place] [phone]  [phone]   [facility] Child Abuse Hotline (813)739-5925 or [phone] (After Hours)

## 2013-09-09 NOTE — ED Provider Notes (Signed)
CSN: [id number]     Arrival date & time 09/09/13  1537 History   This chart was scribed for non-physician practitioner, Coral Ceo, PA-C, working with Flint Melter, MD by Smiley Houseman, ED Scribe. This patient was seen in room TR06C/TR06C and the patient's care was started at 4:03 PM.    Chief Complaint  Patient presents with  . Otalgia    Right ear   The history is provided by the patient. No language interpreter was used.   HPI Comments: Jack Floyd is a 28 y.o. male who presents to the Emergency Department complaining of constant right otalgia that started earlier this afternoon. Pt reports he tried cleaning his ear with a Q-tip when he scraped the ear canal, which caused bleeding. He states he placed tissue in his right ear to stop the bleeding. Pt denies taking anything for pain PTA. Pt reports that his right ear is continuously itching, but he denies any ringing or hearing loss in his right ear. Patient reports drainage from his right ear for the past 6-8 months. He reports the drainage is worse when he lays on his right side at night. Pt states he had his right upper tooth pulled, because it was causing continuous ear infections, but his ear drainage continues. Pt states he used antibiotic ear drops for a couple of days last week. No fevers, chills, sore throat, nasal congestion, rhinorrhea, headache, trismus, neck pain/swelling, facial swelling.    Past Medical History  Diagnosis Date  . Asthma    Past Surgical History  Procedure Laterality Date  . Other surgical history  Facial surgery  . Shoulder surgery Bilateral    No family history on file. History  Substance Use Topics  . Smoking status: Current Every Day Smoker  . Smokeless tobacco: Not on file  . Alcohol Use: No    Review of Systems  Constitutional: Negative for fever, chills, activity change, appetite change and fatigue.  HENT: Positive for ear discharge and ear pain. Negative for congestion,  drooling, facial swelling, hearing loss, rhinorrhea, sinus pressure, sore throat and tinnitus.   Gastrointestinal: Negative for nausea, vomiting and abdominal pain.  Skin: Negative for color change and rash.  Neurological: Negative for headaches.  Psychiatric/Behavioral: Negative for behavioral problems and confusion.  All other systems reviewed and are negative.   Allergies  Red dye  Home Medications   No current outpatient prescriptions on file. Triage Vitals: BP 145/97  Pulse 85  Temp(Src) 98.6 F (37 C) (Oral)  Resp 18  Ht 5\' 11"  (1.803 m)  Wt 215 lb (97.523 kg)  BMI 30.00 kg/m2  SpO2 97%  Filed Vitals:   09/09/13 1542  BP: 145/97  Pulse: 85  Temp: 98.6 F (37 C)  TempSrc: Oral  Resp: 18  Height: 5\' 11"  (1.803 m)  Weight: 215 lb (97.523 kg)  SpO2: 97%    Physical Exam  Nursing note and vitals reviewed. Constitutional: He is oriented to person, place, and time. He appears well-developed and well-nourished. No distress.  HENT:  Head: Normocephalic and atraumatic.  Right Ear: Tympanic membrane and external ear normal.  Left Ear: Tympanic membrane and external ear normal.  Ears:  Nose: Nose normal.  Mouth/Throat: Uvula is midline, oropharynx is clear and moist and mucous membranes are normal. No oropharyngeal exudate, posterior oropharyngeal edema or posterior oropharyngeal erythema.  Left TM gray and translucent. Right TM with erythema. No bulging. Possible small punctate lesion to the middle of the right TM. Minimal amount of  bright red blood present in the right EAC. No visible laceration or active bleeding. No discharge in the right EAC. No mastoid or tragal tenderness bilaterally. No erythema to the posterior pharynx. Tonsils without edema or exudates. Uvula midline. No trismus. No difficulty controlling secretions. No tenderness to the face throughout.    Eyes: Conjunctivae and EOM are normal. Right eye exhibits no discharge. Left eye exhibits no discharge.   Neck: Neck supple. No tracheal deviation present.  Cardiovascular: Normal rate.   Pulmonary/Chest: Effort normal. No respiratory distress.  Musculoskeletal: Normal range of motion.  Lymphadenopathy:       Head (right side): No submandibular, no tonsillar, no preauricular, no posterior auricular and no occipital adenopathy present.       Head (left side): No submandibular, no tonsillar, no preauricular, no posterior auricular and no occipital adenopathy present.    He has no cervical adenopathy.  Neurological: He is alert and oriented to person, place, and time.  Skin: Skin is warm and dry. No rash noted.  Psychiatric: He has a normal mood and affect. His behavior is normal.    ED Course  Procedures (including critical care time) DIAGNOSTIC STUDIES: Oxygen Saturation is 97% on RA, normal by my interpretation.    COORDINATION OF CARE: 4:20 PM-Will prescribe antibiotics.  Patient informed of current plan of treatment and evaluation and agrees with plan.    MDM   Jack Floyd is a 28 y.o. male who presents to the Emergency Department complaining of constant right otalgia that started earlier this afternoon. Patient likely sustained a laceration to the EAC due to trauma from ear cleaning. Bleeding controlled. No ruptured TM. Patient has evidence of a developing right OM. Will prescribe amoxicillin. Patient afebrile and non-toxic in appearance. No evidence of mastoiditis. Patient likely needs ENT referral for continued care of consistent ear drainage.    Rechecks  5:15 PM = Patient left without prescription or discharge papers or prescription     Discharge Medication List as of 09/09/2013  4:37 PM    START taking these medications   Details  amoxicillin (AMOXIL) 500 MG capsule Take 1 capsule (500 mg total) by mouth 2 (two) times daily., Starting 09/09/2013, Until Discontinued, Print        Final impressions: 1. Right ear pain      Thomasenia SalesJessica Katlin Evelean Bigler PA-C   I  personally performed the services described in this documentation, which was scribed in my presence. The recorded information has been reviewed and is accurate.    Jillyn LedgerJessica K Sesar Madewell, PA-C 09/10/13 2204

## 2013-09-09 NOTE — ED Notes (Signed)
Pt reports trying to clean out his ear with a q-tip when he noticed blood draining from right ear onset about 2 hours ago.

## 2013-09-12 NOTE — ED Provider Notes (Signed)
Medical screening examination/treatment/procedure(s) were performed by non-physician practitioner and as supervising physician I was immediately available for consultation/collaboration.   EKG Interpretation None       Flint MelterElliott L Nateisha Moyd, MD 09/12/13 [phone]

## 2013-12-06 ENCOUNTER — Emergency Department (HOSPITAL_COMMUNITY)
Admission: EM | Admit: 2013-12-06 | Discharge: 2013-12-06 | Disposition: A | Discharge status: Home / Self Care | Payer: No Typology Code available for payment source | Attending: Emergency Medicine | Admitting: Emergency Medicine

## 2013-12-06 ENCOUNTER — Encounter (HOSPITAL_COMMUNITY): Payer: Self-pay | Admitting: Emergency Medicine

## 2013-12-06 DIAGNOSIS — F17200 Nicotine dependence, unspecified, uncomplicated: Secondary | ICD-10-CM | POA: Insufficient documentation

## 2013-12-06 DIAGNOSIS — Z79899 Other long term (current) drug therapy: Secondary | ICD-10-CM | POA: Insufficient documentation

## 2013-12-06 DIAGNOSIS — J45909 Unspecified asthma, uncomplicated: Secondary | ICD-10-CM | POA: Insufficient documentation

## 2013-12-06 DIAGNOSIS — H60399 Other infective otitis externa, unspecified ear: Secondary | ICD-10-CM | POA: Insufficient documentation

## 2013-12-06 DIAGNOSIS — H6090 Unspecified otitis externa, unspecified ear: Secondary | ICD-10-CM

## 2013-12-06 MED ORDER — IBUPROFEN 600 MG PO TABS: 600.0000 mg | ORAL_TABLET | Freq: Four times a day (QID) | ORAL | Status: DC | PRN

## 2013-12-06 MED ORDER — TRAMADOL HCL 50 MG PO TABS: 50.0000 mg | ORAL_TABLET | Freq: Four times a day (QID) | ORAL | Status: DC | PRN

## 2013-12-06 MED ORDER — ANTIPYRINE-BENZOCAINE 5.4-1.4 % OT SOLN
3.0000 [drp] | Freq: Once | OTIC | Status: AC
Start: 2013-12-06 — End: 2013-12-06
  Administered 2013-12-06: 4 [drp] via OTIC
  Filled 2013-12-06: qty 10

## 2013-12-06 MED ORDER — CIPROFLOXACIN-DEXAMETHASONE 0.3-0.1 % OT SUSP: 4.0000 [drp] | Freq: Two times a day (BID) | OTIC | Status: DC

## 2013-12-06 MED ORDER — IBUPROFEN 200 MG PO TABS
600.0000 mg | ORAL_TABLET | Freq: Once | ORAL | Status: AC
  Administered 2013-12-06: 600 mg via ORAL
  Filled 2013-12-06: qty 3

## 2013-12-06 MED ORDER — ANTIPYRINE-BENZOCAINE 5.4-1.4 % OT SOLN: 3.0000 [drp] | OTIC | Status: DC | PRN

## 2013-12-06 NOTE — Discharge Instructions (Signed)
Otitis Externa Otitis externa is a bacterial or fungal infection of the outer ear canal. This is the area from the eardrum to the outside of the ear. Otitis externa is sometimes called "swimmer's ear." CAUSES  Possible causes of infection include:  Swimming in dirty water.  Moisture remaining in the ear after swimming or bathing.  Mild injury (trauma) to the ear.  Objects stuck in the ear (foreign body).  Cuts or scrapes (abrasions) on the outside of the ear. SYMPTOMS  The first symptom of infection is often itching in the ear canal. Later signs and symptoms may include swelling and redness of the ear canal, ear pain, and yellowish-white fluid (pus) coming from the ear. The ear pain may be worse when pulling on the earlobe. DIAGNOSIS  Your caregiver will perform a physical exam. A sample of fluid may be taken from the ear and examined for bacteria or fungi. TREATMENT  Antibiotic ear drops are often given for 10 to 14 days. Treatment may also include pain medicine or corticosteroids to reduce itching and swelling. PREVENTION   Keep your ear dry. Use the corner of a towel to absorb water out of the ear canal after swimming or bathing.  Avoid scratching or putting objects inside your ear. This can damage the ear canal or remove the protective wax that lines the canal. This makes it easier for bacteria and fungi to grow.  Avoid swimming in lakes, polluted water, or poorly chlorinated pools.  You may use ear drops made of rubbing alcohol and vinegar after swimming. Combine equal parts of white vinegar and alcohol in a bottle. Put 3 or 4 drops into each ear after swimming. HOME CARE INSTRUCTIONS   Apply antibiotic ear drops to the ear canal as prescribed by your caregiver.  Only take over-the-counter or prescription medicines for pain, discomfort, or fever as directed by your caregiver.  If you have diabetes, follow any additional treatment instructions from your caregiver.  Keep all  follow-up appointments as directed by your caregiver. SEEK MEDICAL CARE IF:   You have a fever.  Your ear is still red, swollen, painful, or draining pus after 3 days.  Your redness, swelling, or pain gets worse.  You have a severe headache.  You have redness, swelling, pain, or tenderness in the area behind your ear. MAKE SURE YOU:   Understand these instructions.  Will watch your condition.  Will get help right away if you are not doing well or get worse. Document Released: 06/07/2005 Document Revised: 08/30/2011 Document Reviewed: 06/24/2011 Exit[employer] Patient Information 2015 [employer]. This information is not intended to replace advice given to you by your health care provider. Make sure you discuss any questions you have with your health care provider.  

## 2013-12-06 NOTE — ED Notes (Signed)
Pt. reports right ear ache onset yesterday , denies injury / no drainage , no fever or chills.

## 2013-12-06 NOTE — ED Notes (Signed)
Reviewed medications with pt thoroughly, pain medication prescribed to pt to manage pain.

## 2013-12-06 NOTE — ED Provider Notes (Signed)
CSN: [id number]     Arrival date & time 12/06/13  0351 History   First MD Initiated Contact with Patient 12/06/13 0546     Chief Complaint  Patient presents with  . Otalgia     (Consider location/radiation/quality/duration/timing/severity/associated sxs/prior Treatment) HPI Comments: 28 y.o. male complains of pain in right ear for 1 days. No fever or URI symptoms. Has not been swimming. + clear discharge from the ear. Pain acutely worsened overnight. No diabetes hx, no n/v/f/c.   Patient is a 28 y.o. male presenting with ear pain. The history is provided by the patient.  Otalgia Associated symptoms: ear discharge   Associated symptoms: no rash     Past Medical History  Diagnosis Date  . Asthma    Past Surgical History  Procedure Laterality Date  . Other surgical history  Facial surgery  . Shoulder surgery Bilateral    No family history on file. History  Substance Use Topics  . Smoking status: Current Every Day Smoker  . Smokeless tobacco: Not on file  . Alcohol Use: No    Review of Systems  Constitutional: Positive for activity change.  HENT: Positive for ear discharge and ear pain. Negative for facial swelling.   Skin: Negative for rash.  Allergic/Immunologic: Negative for immunocompromised state.      Allergies  Red dye  Home Medications   Prior to Admission medications   Medication Sig Start Date End Date Taking? Authorizing Indiya Izquierdo  Acetaminophen (TYLENOL PO) Take 2 tablets by mouth daily as needed (pain).   Yes Historical Kamoni Depree, MD  antipyrine-benzocaine Lyla Son(AURALGAN) otic solution Place 3-4 drops into the right ear every 2 (two) hours as needed for ear pain. 12/06/13   Derwood KaplanAnkit Nanavati, MD  ciprofloxacin-dexamethasone (CIPRODEX) otic suspension Place 4 drops into the right ear 2 (two) times daily. 12/06/13   Derwood KaplanAnkit Nanavati, MD  ibuprofen (ADVIL,MOTRIN) 600 MG tablet Take 1 tablet (600 mg total) by mouth every 6 (six) hours as needed. 12/06/13   Derwood KaplanAnkit Nanavati,  MD  traMADol (ULTRAM) 50 MG tablet Take 1 tablet (50 mg total) by mouth every 6 (six) hours as needed. 12/06/13   Ankit Rhunette CroftNanavati, MD   BP 147/82  Pulse 76  Temp(Src) 98.2 F (36.8 C) (Oral)  Resp 22  Ht 5\' 11"  (1.803 m)  Wt 244 lb (110.678 kg)  BMI 34.05 kg/m2  SpO2 96% Physical Exam  Nursing note and vitals reviewed. Constitutional: He appears well-developed.  HENT:  Head: Atraumatic.   Right ear reveals tenderness of the tragus; debris and inflammation in external canal. TM is not well seen due to debris, but visualized aspects appear normal. No mastoid tenderness.  Eyes: Conjunctivae are normal.  Neck: Neck supple.  Pulmonary/Chest: Effort normal.  Neurological: He is alert.    ED Course  Procedures (including critical care time) Labs Review Labs Reviewed - No data to display  Imaging Review No results found.   EKG Interpretation None      MDM   Final diagnoses:  Otitis externa   Pt comes in with cc of earaches. Acute pain. Appears to be otitis externa. Appropriate topical meds provided. Will d/c.   Derwood KaplanAnkit Nanavati, MD 12/06/13 [phone]

## 2014-02-23 ENCOUNTER — Encounter (HOSPITAL_COMMUNITY): Payer: Self-pay | Admitting: Emergency Medicine

## 2014-02-23 ENCOUNTER — Emergency Department (HOSPITAL_COMMUNITY)
Admission: EM | Admit: 2014-02-23 | Discharge: 2014-02-23 | Disposition: A | Discharge status: Home / Self Care | Payer: No Typology Code available for payment source | Attending: Emergency Medicine | Admitting: Emergency Medicine

## 2014-02-23 DIAGNOSIS — J45909 Unspecified asthma, uncomplicated: Secondary | ICD-10-CM | POA: Insufficient documentation

## 2014-02-23 DIAGNOSIS — F17200 Nicotine dependence, unspecified, uncomplicated: Secondary | ICD-10-CM | POA: Insufficient documentation

## 2014-02-23 DIAGNOSIS — R112 Nausea with vomiting, unspecified: Secondary | ICD-10-CM | POA: Insufficient documentation

## 2014-02-23 DIAGNOSIS — R1013 Epigastric pain: Secondary | ICD-10-CM | POA: Insufficient documentation

## 2014-02-23 DIAGNOSIS — K219 Gastro-esophageal reflux disease without esophagitis: Secondary | ICD-10-CM | POA: Insufficient documentation

## 2014-02-23 MED ORDER — LANSOPRAZOLE 30 MG PO CPDR: 30.0000 mg | DELAYED_RELEASE_CAPSULE | Freq: Every day | ORAL | Status: DC

## 2014-02-23 MED ORDER — ONDANSETRON 4 MG PO TBDP
4.0000 mg | ORAL_TABLET | Freq: Once | ORAL | Status: AC
  Administered 2014-02-23: 4 mg via ORAL
  Filled 2014-02-23: qty 1

## 2014-02-23 MED ORDER — GI COCKTAIL ~~LOC~~
30.0000 mL | Freq: Once | ORAL | Status: AC
  Administered 2014-02-23: 30 mL via ORAL
  Filled 2014-02-23: qty 30

## 2014-02-23 MED ORDER — ONDANSETRON 4 MG PO TBDP: ORAL_TABLET | ORAL | Status: DC

## 2014-02-23 NOTE — Discharge Instructions (Signed)
Gastroesophageal Reflux Disease, Adult  Gastroesophageal reflux disease (GERD) happens when acid from your stomach flows up into the esophagus. When acid comes in contact with the esophagus, the acid causes soreness (inflammation) in the esophagus. Over time, GERD may create small holes (ulcers) in the lining of the esophagus.  CAUSES   · Increased body weight. This puts pressure on the stomach, making acid rise from the stomach into the esophagus.  · Smoking. This increases acid production in the stomach.  · Drinking alcohol. This causes decreased pressure in the lower esophageal sphincter (valve or ring of muscle between the esophagus and stomach), allowing acid from the stomach into the esophagus.  · Late evening meals and a full stomach. This increases pressure and acid production in the stomach.  · A malformed lower esophageal sphincter.  Sometimes, no cause is found.  SYMPTOMS   · Burning pain in the lower part of the mid-chest behind the breastbone and in the mid-stomach area. This may occur twice a week or more often.  · Trouble swallowing.  · Sore throat.  · Dry cough.  · Asthma-like symptoms including chest tightness, shortness of breath, or wheezing.  DIAGNOSIS   Your caregiver may be able to diagnose GERD based on your symptoms. In some cases, X-rays and other tests may be done to check for complications or to check the condition of your stomach and esophagus.  TREATMENT   Your caregiver may recommend over-the-counter or prescription medicines to help decrease acid production. Ask your caregiver before starting or adding any new medicines.   HOME CARE INSTRUCTIONS   · Change the factors that you can control. Ask your caregiver for guidance concerning weight loss, quitting smoking, and alcohol consumption.  · Avoid foods and drinks that make your symptoms worse, such as:  ¨ Caffeine or alcoholic drinks.  ¨ Chocolate.  ¨ Peppermint or mint flavorings.  ¨ Garlic and onions.  ¨ Spicy foods.  ¨ Citrus fruits,  such as oranges, lemons, or limes.  ¨ Tomato-based foods such as sauce, chili, salsa, and pizza.  ¨ Fried and fatty foods.  · Avoid lying down for the 3 hours prior to your bedtime or prior to taking a nap.  · Eat small, frequent meals instead of large meals.  · Wear loose-fitting clothing. Do not wear anything tight around your waist that causes pressure on your stomach.  · Raise the head of your bed 6 to 8 inches with wood blocks to help you sleep. Extra pillows will not help.  · Only take over-the-counter or prescription medicines for pain, discomfort, or fever as directed by your caregiver.  · Do not take aspirin, ibuprofen, or other nonsteroidal anti-inflammatory drugs (NSAIDs).  SEEK IMMEDIATE MEDICAL CARE IF:   · You have pain in your arms, neck, jaw, teeth, or back.  · Your pain increases or changes in intensity or duration.  · You develop nausea, vomiting, or sweating (diaphoresis).  · You develop shortness of breath, or you faint.  · Your vomit is green, yellow, black, or looks like coffee grounds or blood.  · Your stool is red, bloody, or black.  These symptoms could be signs of other problems, such as heart disease, gastric bleeding, or esophageal bleeding.  MAKE SURE YOU:   · Understand these instructions.  · Will watch your condition.  · Will get help right away if you are not doing well or get worse.  Document Released: 03/17/2005 Document Revised: 08/30/2011 Document Reviewed: 12/25/2010  ExitCare® Patient   Information ©2015 [employer]. This information is not intended to replace advice given to you by your health care provider. Make sure you discuss any questions you have with your health care provider.  Food Choices for Gastroesophageal Reflux Disease  When you have gastroesophageal reflux disease (GERD), the foods you eat and your eating habits are very important. Choosing the right foods can help ease the discomfort of GERD.  WHAT GENERAL GUIDELINES DO I NEED TO FOLLOW?  · Choose fruits,  vegetables, whole grains, low-fat dairy products, and low-fat meat, fish, and poultry.  · Limit fats such as oils, salad dressings, butter, nuts, and avocado.  · Keep a food diary to identify foods that cause symptoms.  · Avoid foods that cause reflux. These may be different for different people.  · Eat frequent small meals instead of three large meals each day.  · Eat your meals slowly, in a relaxed setting.  · Limit fried foods.  · Cook foods using methods other than frying.  · Avoid drinking alcohol.  · Avoid drinking large amounts of liquids with your meals.  · Avoid bending over or lying down until 2-3 hours after eating.  WHAT FOODS ARE NOT RECOMMENDED?  The following are some foods and drinks that may worsen your symptoms:  Vegetables  Tomatoes. Tomato juice. Tomato and spaghetti sauce. Chili peppers. Onion and garlic. Horseradish.  Fruits  Oranges, grapefruit, and lemon (fruit and juice).  Meats  High-fat meats, fish, and poultry. This includes hot dogs, ribs, ham, sausage, salami, and bacon.  Dairy  Whole milk and chocolate milk. Sour cream. Cream. Butter. Ice cream. Cream cheese.   Beverages  Coffee and tea, with or without caffeine. Carbonated beverages or energy drinks.  Condiments  Hot sauce. Barbecue sauce.   Sweets/Desserts  Chocolate and cocoa. Donuts. Peppermint and spearmint.  Fats and Oils  High-fat foods, including French fries and potato chips.  Other  Vinegar. Strong spices, such as black pepper, white pepper, red pepper, cayenne, curry powder, cloves, ginger, and chili powder.  The items listed above may not be a complete list of foods and beverages to avoid. Contact your dietitian for more information.  Document Released: 06/07/2005 Document Revised: 06/12/2013 Document Reviewed: 04/11/2013  ExitCare® Patient Information ©2015 [employer]. This information is not intended to replace advice given to you by your health care provider. Make sure you discuss any questions you have with your  health care provider.

## 2014-02-23 NOTE — ED Notes (Signed)
The opt has been vomiting for one week nio diarrhea and at night he feels like thge food backs up into the back of his throat

## 2014-02-23 NOTE — ED Provider Notes (Signed)
CSN: [id number]     Arrival date & time 02/23/14  0423 History   First MD Initiated Contact with Patient 02/23/14 [phone]     Chief Complaint  Patient presents with  . Emesis     (Consider location/radiation/quality/duration/timing/severity/associated sxs/prior Treatment) Patient is a 28 y.o. male presenting with vomiting.  Emesis Associated symptoms: abdominal pain   Associated symptoms: no chills, no diarrhea and no headaches    Patient presents with epigastric pain radiating to her chest with episode of vomiting this morning. He ate dinner at roughly 10 PM and went to sleep around midnight. States he has a bad taste in the back of his mouth. Specifically says that it feels like food is coming up from his stomach into his chest. Denies nausea currently. Pain is improved. No shortness of breath. Denies melena or gross blood in stool. No obvious blood in vomit. Denies NSAID use. Past Medical History  Diagnosis Date  . Asthma    Past Surgical History  Procedure Laterality Date  . Other surgical history  Facial surgery  . Shoulder surgery Bilateral    No family history on file. History  Substance Use Topics  . Smoking status: Current Every Day Smoker  . Smokeless tobacco: Not on file  . Alcohol Use: No    Review of Systems  Constitutional: Negative for fever and chills.  Respiratory: Negative for cough, shortness of breath and wheezing.   Cardiovascular: Positive for chest pain.  Gastrointestinal: Positive for nausea, vomiting and abdominal pain. Negative for diarrhea, constipation and blood in stool.  Musculoskeletal: Negative for back pain, neck pain and neck stiffness.  Skin: Negative for rash and wound.  Neurological: Negative for dizziness, weakness, light-headedness, numbness and headaches.  All other systems reviewed and are negative.     Allergies  Red dye  Home Medications   Prior to Admission medications   Medication Sig Start Date End Date Taking? Authorizing  Provider  Acetaminophen (TYLENOL PO) Take 2 tablets by mouth daily as needed (pain).   Yes Historical Provider, MD   BP 135/85  Pulse 73  Temp(Src) 97.9 F (36.6 C) (Oral)  Resp 18  Ht  (1.803 m)  Wt 246 lb (111.585 kg)  BMI 34.33 kg/m2  SpO2 94% Physical Exam  Nursing note and vitals reviewed. Constitutional: He is oriented to person, place, and time. He appears well-developed and well-nourished. No distress.  HENT:  Head: Normocephalic and atraumatic.  Mouth/Throat: Oropharynx is clear and moist.  Eyes: EOM are normal. Pupils are equal, round, and reactive to light.  Neck: Normal range of motion. Neck supple.  Cardiovascular: Normal rate and regular rhythm.  Exam reveals no gallop and no friction rub.   No murmur heard. Pulmonary/Chest: Effort normal and breath sounds normal. No respiratory distress. He has no wheezes. He has no rales. He exhibits no tenderness.  Abdominal: Soft. Bowel sounds are normal. He exhibits no distension and no mass. There is tenderness (mild epigastric tenderness.). There is no rebound and no guarding.  Musculoskeletal: Normal range of motion. He exhibits no edema and no tenderness.  No calf swelling or tenderness.  Neurological: He is alert and oriented to person, place, and time.  Moves all extremities without deficit. Sensation is grossly intact.  Skin: Skin is warm and dry. No rash noted. No erythema.  Psychiatric: He has a normal mood and affect. His behavior is normal.    ED Course  Procedures (including critical care time) Labs Review Labs Reviewed - No data  to display  Imaging Review No results found.   EKG Interpretation None      Date: 02/23/2014  Rate: 71  Rhythm: normal sinus rhythm  QRS Axis: normal  Intervals: normal  ST/T Wave abnormalities: normal  Conduction Disutrbances:none  Narrative Interpretation:   Old EKG Reviewed: none available   MDM   Final diagnoses:  None    History and exam are consistent  with gastric esophageal reflux disease. Advised to avoid acidic and spicy foods. Also advised to avoid all NSAIDs. Encouraged to eat dinner early in the evening and sleep with several pillows. Will start on PPI. May take over-the-counter Mylanta or Maalox as needed. For persistent symptoms he's been referred to a gastroenterologist. He's been given strict return precautions and has voiced understanding.   Loren Racer, MD 02/23/14 [phone]

## 2014-03-31 ENCOUNTER — Emergency Department (HOSPITAL_COMMUNITY)
Admission: EM | Admit: 2014-03-31 | Discharge: 2014-04-01 | Disposition: A | Discharge status: Home / Self Care | Payer: No Typology Code available for payment source | Attending: Emergency Medicine | Admitting: Emergency Medicine

## 2014-03-31 ENCOUNTER — Encounter (HOSPITAL_COMMUNITY): Payer: Self-pay | Admitting: Emergency Medicine

## 2014-03-31 DIAGNOSIS — J45909 Unspecified asthma, uncomplicated: Secondary | ICD-10-CM | POA: Insufficient documentation

## 2014-03-31 DIAGNOSIS — R112 Nausea with vomiting, unspecified: Secondary | ICD-10-CM | POA: Insufficient documentation

## 2014-03-31 DIAGNOSIS — Z720 Tobacco use: Secondary | ICD-10-CM | POA: Insufficient documentation

## 2014-03-31 DIAGNOSIS — R197 Diarrhea, unspecified: Secondary | ICD-10-CM | POA: Insufficient documentation

## 2014-03-31 LAB — COMPREHENSIVE METABOLIC PANEL
ALBUMIN: 4.5 g/dL (ref 3.5–5.2)
ALK PHOS: 65 U/L (ref 39–117)
ALT: 27 U/L (ref 0–53)
ANION GAP: 13 (ref 5–15)
AST: 17 U/L (ref 0–37)
BUN: 8 mg/dL (ref 6–23)
CO2: 24 mEq/L (ref 19–32)
Calcium: 10.1 mg/dL (ref 8.4–10.5)
Chloride: 100 mEq/L (ref 96–112)
Creatinine, Ser: 1.04 mg/dL (ref 0.50–1.35)
GFR calc Af Amer: >90 mL/min (ref >90)
GFR calc non Af Amer: >90 mL/min (ref >90)
Glucose, Bld: 125 mg/dL — ABNORMAL HIGH (ref 70–99)
POTASSIUM: 4.7 meq/L (ref 3.7–5.3)
Sodium: 137 mEq/L (ref 137–147)
TOTAL PROTEIN: 8.1 g/dL (ref 6.0–8.3)
Total Bilirubin: 0.4 mg/dL (ref 0.3–1.2)

## 2014-03-31 LAB — CBC WITH DIFFERENTIAL/PLATELET
BASOS PCT: 0 % (ref 0–1)
Basophils Absolute: 0 10*3/uL (ref 0.0–0.1)
EOS ABS: 0 10*3/uL (ref 0.0–0.7)
Eosinophils Relative: 0 % (ref 0–5)
HCT: 46.1 % (ref 39.0–52.0)
HEMOGLOBIN: 16 g/dL (ref 13.0–17.0)
Lymphocytes Relative: 14 % (ref 12–46)
Lymphs Abs: 0.9 10*3/uL (ref 0.7–4.0)
MCH: 27.9 pg (ref 26.0–34.0)
MCHC: 34.7 g/dL (ref 30.0–36.0)
MCV: 80.3 fL (ref 78.0–100.0)
MONOS PCT: 4 % (ref 3–12)
Monocytes Absolute: 0.3 10*3/uL (ref 0.1–1.0)
NEUTROS PCT: 82 % — AB (ref 43–77)
Neutro Abs: 5.2 10*3/uL (ref 1.7–7.7)
PLATELETS: 241 10*3/uL (ref 150–400)
RBC: 5.74 MIL/uL (ref 4.22–5.81)
RDW: 13 % (ref 11.5–15.5)
WBC: 6.4 10*3/uL (ref 4.0–10.5)

## 2014-03-31 LAB — LIPASE, BLOOD: LIPASE: 22 U/L (ref 11–59)

## 2014-03-31 MED ORDER — ONDANSETRON HCL 4 MG/2ML IJ SOLN
4.0000 mg | Freq: Once | INTRAMUSCULAR | Status: AC
  Administered 2014-03-31: 4 mg via INTRAVENOUS
  Filled 2014-03-31: qty 2

## 2014-03-31 MED ORDER — ONDANSETRON 8 MG PO TBDP: 8.0000 mg | ORAL_TABLET | Freq: Three times a day (TID) | ORAL | Status: DC | PRN

## 2014-03-31 MED ORDER — SODIUM CHLORIDE 0.9 % IV SOLN
1000.0000 mL | Freq: Once | INTRAVENOUS | Status: AC
  Administered 2014-03-31: 1000 mL via INTRAVENOUS

## 2014-03-31 MED ORDER — PROMETHAZINE HCL 25 MG PO TABS: 25.0000 mg | ORAL_TABLET | Freq: Four times a day (QID) | ORAL | Status: DC | PRN

## 2014-03-31 MED ORDER — PROMETHAZINE HCL 25 MG/ML IJ SOLN
25.0000 mg | Freq: Once | INTRAMUSCULAR | Status: AC
  Administered 2014-03-31: 25 mg via INTRAVENOUS
  Filled 2014-03-31: qty 1

## 2014-03-31 MED ORDER — SODIUM CHLORIDE 0.9 % IV SOLN: 1000.0000 mL | INTRAVENOUS | Status: DC

## 2014-03-31 MED ORDER — ONDANSETRON 4 MG PO TBDP
8.0000 mg | ORAL_TABLET | Freq: Once | ORAL | Status: AC
  Administered 2014-03-31: 8 mg via ORAL
  Filled 2014-03-31: qty 2

## 2014-03-31 NOTE — ED Notes (Signed)
Pt here for vomiting and abd pain since last night. Hasn't had anything to drink or eat recently because it is coming back up.

## 2014-03-31 NOTE — ED Provider Notes (Signed)
CSN: [id number]     Arrival date & time 03/31/14  1712 History   First MD Initiated Contact with Patient 03/31/14 2255     Chief Complaint  Patient presents with  . Abdominal Pain  . Emesis     (Consider location/radiation/quality/duration/timing/severity/associated sxs/prior Treatment) HPI 28 y.o. Male complaining of nausea and vomiting with some loose stools began this am.  Patient states he has been unable to keep fluids down.  He states he has had family members with similar symptoms.  No objective fever but has had some chills.  Patient denies any recent similar episodes.  He does has diffuse crampy abdominal discomfort.   Past Medical History  Diagnosis Date  . Asthma    Past Surgical History  Procedure Laterality Date  . Other surgical history  Facial surgery  . Shoulder surgery Bilateral    No family history on file. History  Substance Use Topics  . Smoking status: Current Every Day Smoker  . Smokeless tobacco: Not on file  . Alcohol Use: No    Review of Systems  All other systems reviewed and are negative.     Allergies  Red dye  Home Medications   Prior to Admission medications   Not on File   BP 162/98  Pulse 47  Temp(Src) 98.3 F (36.8 C) (Oral)  Resp 21  Ht 5\' 11"  (1.803 m)  Wt 240 lb (108.863 kg)  BMI 33.49 kg/m2  SpO2 96% Physical Exam  Nursing note and vitals reviewed. Constitutional: He is oriented to person, place, and time. He appears well-developed and well-nourished.  HENT:  Head: Normocephalic and atraumatic.  Right Ear: External ear normal.  Left Ear: External ear normal.  Nose: Nose normal.  Mouth/Throat: Oropharynx is clear and moist.  Eyes: Conjunctivae and EOM are normal. Pupils are equal, round, and reactive to light.  Neck: Normal range of motion. Neck supple.  Cardiovascular: Normal rate, regular rhythm, normal heart sounds and intact distal pulses.   Pulmonary/Chest: Effort normal and breath sounds normal. No respiratory  distress. He has no wheezes. He exhibits no tenderness.  Abdominal: Soft. Bowel sounds are normal. He exhibits no distension and no mass. There is no tenderness. There is no guarding.  Musculoskeletal: Normal range of motion.  Neurological: He is alert and oriented to person, place, and time. He has normal reflexes. He exhibits normal muscle tone. Coordination normal.  Skin: Skin is warm and dry.  Psychiatric: He has a normal mood and affect. His behavior is normal. Judgment and thought content normal.    ED Course  Procedures (including critical care time) Labs Review Labs Reviewed  CBC WITH DIFFERENTIAL - Abnormal; Notable for the following:    Neutrophils Relative % 82 (*)    All other components within normal limits  COMPREHENSIVE METABOLIC PANEL - Abnormal; Notable for the following:    Glucose, Bld 125 (*)    All other components within normal limits  LIPASE, BLOOD  URINALYSIS, ROUTINE W REFLEX MICROSCOPIC     MDM   Final diagnoses:  Nausea vomiting and diarrhea    Patient given antiemetics and iv fluids.  Abdomen remains nontender to palpation.  No vomiting since antiemetics.     Hilario Quarryanielle S Sulamita Lafountain, MD 04/01/14 [phone]

## 2014-03-31 NOTE — Discharge Instructions (Signed)

## 2014-04-01 LAB — URINALYSIS, ROUTINE W REFLEX MICROSCOPIC
Bilirubin Urine: NEGATIVE
Glucose, UA: NEGATIVE mg/dL
HGB URINE DIPSTICK: NEGATIVE
Ketones, ur: 15 mg/dL — AB
LEUKOCYTES UA: NEGATIVE
NITRITE: NEGATIVE
Protein, ur: 30 mg/dL — AB
SPECIFIC GRAVITY, URINE: 1.036 — AB (ref 1.005–1.030)
UROBILINOGEN UA: 0.2 mg/dL (ref 0.0–1.0)
pH: 6 (ref 5.0–8.0)

## 2014-04-01 LAB — URINE MICROSCOPIC-ADD ON

## 2015-11-07 ENCOUNTER — Emergency Department (HOSPITAL_COMMUNITY)
Admission: EM | Admit: 2015-11-07 | Discharge: 2015-11-08 | Disposition: A | Discharge status: Home / Self Care | Payer: No Typology Code available for payment source | Attending: Emergency Medicine | Admitting: Emergency Medicine

## 2015-11-07 ENCOUNTER — Encounter (HOSPITAL_COMMUNITY): Payer: Self-pay | Admitting: Emergency Medicine

## 2015-11-07 DIAGNOSIS — J45909 Unspecified asthma, uncomplicated: Secondary | ICD-10-CM | POA: Insufficient documentation

## 2015-11-07 DIAGNOSIS — Z79899 Other long term (current) drug therapy: Secondary | ICD-10-CM | POA: Insufficient documentation

## 2015-11-07 DIAGNOSIS — F17200 Nicotine dependence, unspecified, uncomplicated: Secondary | ICD-10-CM | POA: Insufficient documentation

## 2015-11-07 DIAGNOSIS — K0889 Other specified disorders of teeth and supporting structures: Secondary | ICD-10-CM | POA: Insufficient documentation

## 2015-11-07 NOTE — ED Notes (Addendum)
Patient c/o right upper and lower dental pain and left lower dental pain x2-3 days. Pt has several broken teeth and dental caries. No relief with ibuprofen at home. Rates pain 10/10.

## 2015-11-08 MED ORDER — TRAMADOL HCL 50 MG PO TABS: 50.0000 mg | ORAL_TABLET | Freq: Four times a day (QID) | ORAL | Status: DC | PRN

## 2015-11-08 MED ORDER — PENICILLIN V POTASSIUM 500 MG PO TABS
500.0000 mg | ORAL_TABLET | Freq: Once | ORAL | Status: AC
  Administered 2015-11-08: 500 mg via ORAL
  Filled 2015-11-08: qty 1

## 2015-11-08 MED ORDER — TRAMADOL HCL 50 MG PO TABS
50.0000 mg | ORAL_TABLET | Freq: Once | ORAL | Status: AC
Start: 2015-11-08 — End: 2015-11-08
  Administered 2015-11-08: 50 mg via ORAL
  Filled 2015-11-08: qty 1

## 2015-11-08 MED ORDER — PENICILLIN V POTASSIUM 500 MG PO TABS: 500.0000 mg | ORAL_TABLET | Freq: Three times a day (TID) | ORAL | Status: DC

## 2015-11-08 NOTE — ED Provider Notes (Signed)
CSN: [id number]     Arrival date & time 11/07/15  2209 History   First MD Initiated Contact with Patient 11/08/15 0051     Chief Complaint  Patient presents with  . Dental Pain     (Consider location/radiation/quality/duration/timing/severity/associated sxs/prior Treatment) Patient is a 30 y.o. male presenting with tooth pain. The history is provided by the patient. No language interpreter was used.  Dental Pain Location:  Upper and lower Upper teeth location:  2/RU 2nd molar Lower teeth location:  30/RL 1st molar and 20/LL 2nd bicuspid Quality:  Aching Severity:  Severe Onset quality:  Gradual Duration:  3 days Timing:  Constant Progression:  Worsening Associated symptoms: facial swelling   Associated symptoms: no difficulty swallowing and no fever     Past Medical History  Diagnosis Date  . Asthma    Past Surgical History  Procedure Laterality Date  . Other surgical history  Facial surgery  . Shoulder surgery Bilateral    No family history on file. Social History  Substance Use Topics  . Smoking status: Current Every Day Smoker  . Smokeless tobacco: None  . Alcohol Use: No    Review of Systems  Constitutional: Negative for fever.  HENT: Positive for dental problem and facial swelling. Negative for trouble swallowing.   Gastrointestinal: Negative for nausea.  Musculoskeletal: Negative for neck stiffness.      Allergies  Red dye  Home Medications   Prior to Admission medications   Medication Sig Start Date End Date Taking? Authorizing Provider  ondansetron (ZOFRAN ODT) 8 MG disintegrating tablet Take 1 tablet (8 mg total) by mouth every 8 (eight) hours as needed for nausea or vomiting. 03/31/14   Margarita Grizzleanielle Ray, MD  promethazine (PHENERGAN) 25 MG tablet Take 1 tablet (25 mg total) by mouth every 6 (six) hours as needed for nausea or vomiting. 03/31/14   Margarita Grizzleanielle Ray, MD   BP 153/108 mmHg  Pulse 58  Temp(Src) 99 F (37.2 C)  Resp 18  SpO2 98% Physical  Exam  Constitutional: He is oriented to person, place, and time. He appears well-developed and well-nourished. No distress.  HENT:  Generally good dentition. No pointing abscess or significant caries. No facial swelling.   Neck: Normal range of motion.  Cardiovascular: Normal rate.   Pulmonary/Chest: Effort normal.  Lymphadenopathy:    He has no cervical adenopathy.  Neurological: He is alert and oriented to person, place, and time.  Skin: Skin is warm and dry.  Psychiatric: He has a normal mood and affect.    ED Course  Procedures (including critical care time) Labs Review Labs Reviewed - No data to display  Imaging Review No results found. I have personally reviewed and evaluated these images and lab results as part of my medical decision-making.   EKG Interpretation None      MDM   Final diagnoses:  None    1. Dental pain  Patient presents with 2-3 days of dental pain. No gross infection. Will refer to dental, encourage follow up.    Elpidio AnisShari Derwood Becraft, PA-C 11/08/15 0123  Gilda Creasehristopher J Pollina, MD 11/08/15 [phone]

## 2015-11-08 NOTE — Discharge Instructions (Signed)

## 2017-03-01 ENCOUNTER — Ambulatory Visit (HOSPITAL_COMMUNITY)
Admission: EM | Admit: 2017-03-01 | Discharge: 2017-03-01 | Disposition: A | Discharge status: Home / Self Care | Payer: Self-pay | Attending: Emergency Medicine | Admitting: Emergency Medicine

## 2017-03-01 ENCOUNTER — Encounter (HOSPITAL_COMMUNITY): Payer: Self-pay | Admitting: *Deleted

## 2017-03-01 DIAGNOSIS — J069 Acute upper respiratory infection, unspecified: Secondary | ICD-10-CM

## 2017-03-01 DIAGNOSIS — B9789 Other viral agents as the cause of diseases classified elsewhere: Secondary | ICD-10-CM

## 2017-03-01 MED ORDER — ALBUTEROL SULFATE HFA 108 (90 BASE) MCG/ACT IN AERS: 1.0000 | INHALATION_SPRAY | Freq: Four times a day (QID) | RESPIRATORY_TRACT | 0 refills | Status: AC | PRN

## 2017-03-01 MED ORDER — FLUTICASONE PROPIONATE 50 MCG/ACT NA SUSP: 2.0000 | Freq: Every day | NASAL | 0 refills | Status: DC

## 2017-03-01 MED ORDER — PREDNISONE 10 MG (21) PO TBPK: ORAL_TABLET | ORAL | 0 refills | Status: DC

## 2017-03-01 MED ORDER — FLUTICASONE PROPIONATE 50 MCG/ACT NA SUSP: 2.0000 | Freq: Every day | NASAL | 0 refills | Status: AC

## 2017-03-01 MED ORDER — AEROCHAMBER PLUS MISC: 2 refills | Status: DC

## 2017-03-01 MED ORDER — ALBUTEROL SULFATE HFA 108 (90 BASE) MCG/ACT IN AERS
1.0000 | INHALATION_SPRAY | Freq: Four times a day (QID) | RESPIRATORY_TRACT | 0 refills | Status: DC | PRN
Start: 2017-03-01 — End: 2017-03-01

## 2017-03-01 MED ORDER — AEROCHAMBER PLUS MISC: 2 refills | Status: AC

## 2017-03-01 NOTE — Discharge Instructions (Signed)
albuterol inhaler with a spacer 1-2 puffs every 4-6 hours as needed for coughing, wheezing, finish the prednisone taper, Flonase, Mucinex D, saline nasal irrigation with a Lloyd HugerNeil med sinus rinse or neti pot. Follow up with one of the primary care physicians listed below   Below is a list of primary care practices who are taking new patients for you to follow-up with. [facility] [address] [place], [place] [address] [phone]  [facility] [phone] [address] [place] [address]  [facility]: [address] [place] [id number]  [phone]  [facility]: [address] [place] [id number]   [phone]  [facility]: [address] [place] [address]  [phone]  [facility] : [address] [place] [address] [phone]  [facility]: [address] [place] [phone]  [facility]: [address] [place] [id number] [phone]  Dr. Oneal GroutMahima Pandey [address] [facility] [facility] [place] [id number]  [phone]  Dr. Jackie PlumGeorge Osei-Bonsu, [facility]. [address] [place], [place] [address]  [phone]  Go to www.goodrx.com to look up your medications. This will give you a list of where you can find your prescriptions at the most affordable prices. Or ask the pharmacist what the cash price is, or if they have any other discount programs available to help make your medication more affordable. This can be less expensive than what you would pay with insurance.

## 2017-03-01 NOTE — ED Provider Notes (Signed)
HPI  SUBJECTIVE:  Jack Floyd is a 31 y.o. male who presents with nasal congestion, rhinorrhea, postnasal drip for a day and half. He reports a sore throat with coughing states is not sore at any other time. He reports wheezing in the morning. States it is sleeping at night okay. He denies fevers, bodyaches, headaches, chest pain, shortness of breath. No allergy type symptoms. No sinus pain or pressure, ear pain, dyspnea on exertion. No antipyretic in the past 6-8 hours. He tried Aleve, and a girlfriend hydrocodone for sore throat the first night, he has not needed it since. These medications help. No aggravating factors. The symptoms are not associated with going into cold air, outside the heat, or exposure to pollen. He has a past medical history of asthma, he continues to smoke. He has a history of seasonal allergies in the [facility] [place] and states this does not feel like allergies. No history of diabetes or hypertension. PMD: None.    Past Medical History:  Diagnosis Date  . Asthma     Past Surgical History:  Procedure Laterality Date  . OTHER SURGICAL HISTORY  Facial surgery  . SHOULDER SURGERY Bilateral     History reviewed. No pertinent family history.  Social History  Substance Use Topics  . Smoking status: Current Every Day Smoker  . Smokeless tobacco: Not on file  . Alcohol use No    No current facility-administered medications for this encounter.   Current Outpatient Prescriptions:  .  albuterol (PROVENTIL HFA;VENTOLIN HFA) 108 (90 Base) MCG/ACT inhaler, Inhale 1-2 puffs into the lungs every 6 (six) hours as needed for wheezing or shortness of breath., Disp: 1 Inhaler, Rfl: 0 .  fluticasone (FLONASE) 50 MCG/ACT nasal spray, Place 2 sprays into both nostrils daily., Disp: 16 g, Rfl: 0 .  predniSONE (STERAPRED UNI-PAK 21 TAB) 10 MG (21) TBPK tablet, Dispense one 6 day pack. Take as directed with food., Disp: 21 tablet, Rfl: 0 .  Spacer/Aero-Holding Chambers  (AEROCHAMBER PLUS) inhaler, Use as instructed, Disp: 1 each, Rfl: 2  Allergies  Allergen Reactions  . Red Dye Other (See Comments)    unknown     ROS  As noted in HPI.   Physical Exam  BP 130/72 (BP Location: Right Arm)   Pulse 78   Temp 98.6 F (37 C) (Oral)   Resp 18   SpO2 100%   Constitutional: Well developed, well nourished, no acute distress Eyes:  EOMI, conjunctiva normal bilaterally HENT: Normocephalic, atraumatic,mucus membranes moist. Positive clear rhinorrhea with erythematous, swollen turbinates. No sinus tenderness. Normal oropharynx. No cobblestoning or postnasal drip. Respiratory: Normal inspiratory effort, scattered rhonchi that clear with coughing, good air movement. Occasional wheezing Cardiovascular: Normal rate regular rhythm no murmurs rubs or gallops GI: nondistended skin: No rash, skin intact Musculoskeletal: no deformities Neurologic: Alert & oriented x 3, no focal neuro deficits Psychiatric: Speech and behavior appropriate   ED Course   Medications - No data to display  No orders of the defined types were placed in this encounter.   No results found for this or any previous visit (from the past 24 hour(s)). No results found.  ED Clinical Impression  Viral URI with cough   ED Assessment/Plan  Deferred chest x-ray today in the absence of fevers, hypoxia, focal lung findings. No indications for antibiotics. Presentation consistent with an URI and Concern for early asthma exacerbation. Plan to send home with an albuterol inhaler with a spacer 1-2 puffs every 4-6 hours as needed for  coughing, wheezing, prednisone taper, Flonase, Mucinex D, saline nasal irrigation and primary care referral.   Discussed  MDM, plan and followup with patient. Discussed sn/sx that should prompt return to the ED. Patient  agrees with plan.   Meds ordered this encounter  Medications  . albuterol (PROVENTIL HFA;VENTOLIN HFA) 108 (90 Base) MCG/ACT inhaler    Sig:  Inhale 1-2 puffs into the lungs every 6 (six) hours as needed for wheezing or shortness of breath.    Dispense:  1 Inhaler    Refill:  0  . fluticasone (FLONASE) 50 MCG/ACT nasal spray    Sig: Place 2 sprays into both nostrils daily.    Dispense:  16 g    Refill:  0  . Spacer/Aero-Holding Chambers (AEROCHAMBER PLUS) inhaler    Sig: Use as instructed    Dispense:  1 each    Refill:  2  . predniSONE (STERAPRED UNI-PAK 21 TAB) 10 MG (21) TBPK tablet    Sig: Dispense one 6 day pack. Take as directed with food.    Dispense:  21 tablet    Refill:  0    *This clinic note was created using Scientist, clinical (histocompatibility and immunogenetics). Therefore, there may be occasional mistakes despite careful proofreading.  ?   Domenick Gong, MD 03/01/17 [phone]

## 2017-03-01 NOTE — ED Triage Notes (Signed)
Pt  Reports    Symptoms  Of   Cough  Congestion       stuffyness   And   Scratchy  Throat      Pt  Is   Ambulatory  Alert and  Oriented  And  Appears  In no  Acute   /  Severe  Distress

## 2018-07-20 ENCOUNTER — Other Ambulatory Visit: Payer: Self-pay

## 2018-07-20 ENCOUNTER — Encounter (HOSPITAL_COMMUNITY): Payer: Self-pay | Admitting: *Deleted

## 2018-07-20 ENCOUNTER — Emergency Department (HOSPITAL_COMMUNITY): Payer: 59

## 2018-07-20 ENCOUNTER — Emergency Department (HOSPITAL_COMMUNITY)
Admission: EM | Admit: 2018-07-20 | Discharge: 2018-07-20 | Disposition: A | Discharge status: Home / Self Care | Payer: 59 | Attending: Emergency Medicine | Admitting: Emergency Medicine

## 2018-07-20 DIAGNOSIS — Y990 Civilian activity done for income or pay: Secondary | ICD-10-CM | POA: Diagnosis not present

## 2018-07-20 DIAGNOSIS — M79605 Pain in left leg: Secondary | ICD-10-CM | POA: Insufficient documentation

## 2018-07-20 DIAGNOSIS — Z79899 Other long term (current) drug therapy: Secondary | ICD-10-CM | POA: Diagnosis not present

## 2018-07-20 DIAGNOSIS — F17200 Nicotine dependence, unspecified, uncomplicated: Secondary | ICD-10-CM | POA: Insufficient documentation

## 2018-07-20 DIAGNOSIS — Y9389 Activity, other specified: Secondary | ICD-10-CM | POA: Insufficient documentation

## 2018-07-20 DIAGNOSIS — X500XXA Overexertion from strenuous movement or load, initial encounter: Secondary | ICD-10-CM | POA: Diagnosis not present

## 2018-07-20 DIAGNOSIS — Y9289 Other specified places as the place of occurrence of the external cause: Secondary | ICD-10-CM | POA: Diagnosis not present

## 2018-07-20 DIAGNOSIS — J45909 Unspecified asthma, uncomplicated: Secondary | ICD-10-CM | POA: Insufficient documentation

## 2018-07-20 LAB — URINALYSIS, ROUTINE W REFLEX MICROSCOPIC
Bilirubin Urine: NEGATIVE
Glucose, UA: NEGATIVE mg/dL
Hgb urine dipstick: NEGATIVE
Ketones, ur: NEGATIVE mg/dL
LEUKOCYTES UA: NEGATIVE
Nitrite: NEGATIVE
PROTEIN: NEGATIVE mg/dL
Specific Gravity, Urine: 1.021 (ref 1.005–1.030)
pH: 6 (ref 5.0–8.0)

## 2018-07-20 MED ORDER — METHOCARBAMOL 500 MG PO TABS: 500.0000 mg | ORAL_TABLET | Freq: Two times a day (BID) | ORAL | 0 refills | Status: AC

## 2018-07-20 MED ORDER — METHOCARBAMOL 500 MG PO TABS
500.0000 mg | ORAL_TABLET | Freq: Once | ORAL | Status: AC
  Administered 2018-07-20: 500 mg via ORAL
  Filled 2018-07-20: qty 1

## 2018-07-20 MED ORDER — NAPROXEN 500 MG PO TABS: 500.0000 mg | ORAL_TABLET | Freq: Two times a day (BID) | ORAL | 0 refills | Status: AC

## 2018-07-20 NOTE — ED Provider Notes (Addendum)
[facility] EMERGENCY DEPARTMENT Provider Note   CSN: [id number] Arrival date & time: 07/20/18  1006     History   Chief Complaint Chief Complaint  Patient presents with  . Leg Pain    HPI Jack Floyd is a 33 y.o. male.  33 y.o male with a PMH of Asthma presents to the ED with a chief complaint of sore left leg pain x yesterday. Patient reports he was at work lifting a heavy box when he began to experience a ache pain at the top of the left thigh. He reports "I think I pulled a muscle". States the pain is worse with movement, lifting, denies any alleviating factors. He reports taking tylenol strength x 4 but states no relieve in symptoms. He denies any fever, bowel or bladder symptoms, or abdominal pain.      Past Medical History:  Diagnosis Date  . Asthma     Patient Active Problem List   Diagnosis Date Noted  . ENURESIS 08/18/2006  . ASTHMA, UNSPECIFIED 08/18/2006  . ECZEMA, ATOPIC DERMATITIS 08/18/2006    Past Surgical History:  Procedure Laterality Date  . OTHER SURGICAL HISTORY  Facial surgery  . SHOULDER SURGERY Bilateral         Home Medications    Prior to Admission medications   Medication Sig Start Date End Date Taking? Authorizing Provider  albuterol (PROVENTIL HFA;VENTOLIN HFA) 108 (90 Base) MCG/ACT inhaler Inhale 1-2 puffs into the lungs every 6 (six) hours as needed for wheezing or shortness of breath. 03/01/17   Domenick Gong, MD  fluticasone (FLONASE) 50 MCG/ACT nasal spray Place 2 sprays into both nostrils daily. 03/01/17   Domenick Gong, MD  predniSONE (STERAPRED UNI-PAK 21 TAB) 10 MG (21) TBPK tablet Dispense one 6 day pack. Take as directed with food. 03/01/17   Domenick Gong, MD  Spacer/Aero-Holding Chambers (AEROCHAMBER PLUS) inhaler Use as instructed 03/01/17   Domenick Gong, MD    Family History History reviewed. No pertinent family history.  Social History Social History   Tobacco Use  .  Smoking status: Current Every Day Smoker  . Smokeless tobacco: Never Used  Substance Use Topics  . Alcohol use: No  . Drug use: Yes    Types: Marijuana     Allergies   Penicillins and Red dye   Review of Systems Review of Systems  Constitutional: Negative for chills and fever.  Respiratory: Negative for shortness of breath.   Cardiovascular: Negative for leg swelling.  Musculoskeletal: Positive for myalgias. Negative for back pain.     Physical Exam Updated Vital Signs BP (!) 152/100   Pulse (!) 56   Temp 98.4 F (36.9 C) (Oral)   Resp 16   Ht 5\' 11"  (1.803 m)   Wt 93 kg   SpO2 100%   BMI 28.59 kg/m   Physical Exam Vitals signs and nursing note reviewed.  Constitutional:      Appearance: He is well-developed.  HENT:     Head: Normocephalic and atraumatic.  Eyes:     General: No scleral icterus.    Pupils: Pupils are equal, round, and reactive to light.  Neck:     Musculoskeletal: Normal range of motion.  Cardiovascular:     Heart sounds: Normal heart sounds.  Pulmonary:     Effort: Pulmonary effort is normal.     Breath sounds: Normal breath sounds. No wheezing.  Chest:     Chest wall: No tenderness.  Abdominal:     General: Bowel  sounds are normal. There is no distension.     Palpations: Abdomen is soft.     Tenderness: There is no abdominal tenderness.  Musculoskeletal:        General: No deformity.     Left hip: He exhibits tenderness. He exhibits normal strength and no swelling.       Legs:     Comments: RLE- KF,KE 5/5 strength LLE- HF, HE 5/5 strength Normal gait. No pronator drift. No leg drop.  Patellar reflexes present and symmetric. CN I, II and VIII not tested. CN II-XII grossly intact bilaterally.      Skin:    General: Skin is warm and dry.  Neurological:     Mental Status: He is alert and oriented to person, place, and time.      ED Treatments / Results  Labs (all labs ordered are listed, but only abnormal results are  displayed) Labs Reviewed  URINALYSIS, ROUTINE W REFLEX MICROSCOPIC    EKG None  Radiology Dg Lumbar Spine 2-3 Views  Result Date: 07/20/2018 CLINICAL DATA:  Left leg pain for several days, no known injury, initial encounter EXAM: LUMBAR SPINE - 3 VIEW COMPARISON:  None. FINDINGS: Five lumbar type vertebral bodies are well visualized. Vertebral body height is well maintained. Mild disc space narrowing is seen at L3-4 and L4-5 stable from the prior exam. No soft tissue abnormality is noted. IMPRESSION: Mild degenerative change without acute abnormality. Electronically Signed   By: Alcide Clever M.D.   On: 07/20/2018 11:14    Procedures Procedures (including critical care time)  Medications Ordered in ED Medications  methocarbamol (ROBAXIN) tablet 500 mg (500 mg Oral Given 07/20/18 1042)     Initial Impression / Assessment and Plan / ED Course  I have reviewed the triage vital signs and the nursing notes.  Pertinent labs & imaging results that were available during my care of the patient were reviewed by me and considered in my medical decision making (see chart for details).  Patient presents with left leg pain which began yesterday after lifting a heavy item at work, no relieve with over the counter medication. Reports pain is along the left thigh denies any urinary symptoms but UA was sent for analysis, UA resulted unremarkable no signs of infection he is afebrile during visit.  No left leg swelling, or calf tenderness, low suspicion for DVT.Wells criteria is negative at this time. No risk factors of previous history of DVT. Will obtain lumbar xray to r/o any acute process. Denies any saddle anesthesia, bowel or bladder complaints low suspicion for acute process.  He was provided with robaxin to help with his symptoms, will reassess for pain and discomfort level. Lumbar xray showed: Mild degenerative change without acute abnormality.  Patient reports some relief with Robaxin, reports  of pain still there.  I have explained to patient that likely suffering from muscle spasms will prescribe him muscle relaxers along with anti-inflammatories to help with his pain.  He was also given his results from x-ray.  Patient reports he does not have a PCP to follow-up with we will give him referral for [place] [facility] clinic.  Return precautions discussed at length.  Final Clinical Impressions(s) / ED Diagnoses   Final diagnoses:  Left leg pain    ED Discharge Orders    None       Claude Manges, PA-C 07/20/18 1132    Claude Manges, PA-C 07/20/18 1146    Virgina Norfolk, DO 07/20/18 1725

## 2018-07-20 NOTE — ED Triage Notes (Signed)
PT reports Lt leg pain since WED.

## 2018-07-20 NOTE — Discharge Instructions (Signed)
I have prescribed muscle relaxers for your pain, please do not drink or drive while taking this medications as they can make you drowsy.  Please follow-up with PCP in 1 week for reevaluation of your symptoms.  You experience any bowel or bladder incontinence, fever, worsening in your symptoms please return to the ED. ° °

## 2018-07-20 NOTE — ED Notes (Signed)
Patient verbalizes understanding of discharge instructions. Opportunity for questioning and answers were provided. Armband removed by staff, pt discharged from ED. Prescriptions/pharmacy and follow up care reviewed. Pt ambulatory to lobby.

## 2020-01-09 IMAGING — DX DG LUMBAR SPINE 2-3V
3 series · 3 of 3 positions shown · non-contrast
Comparison: None.

CLINICAL DATA: Left leg pain for several days, no known injury,
initial encounter

EXAM:
LUMBAR SPINE - 3 VIEW

[l-spine ap]
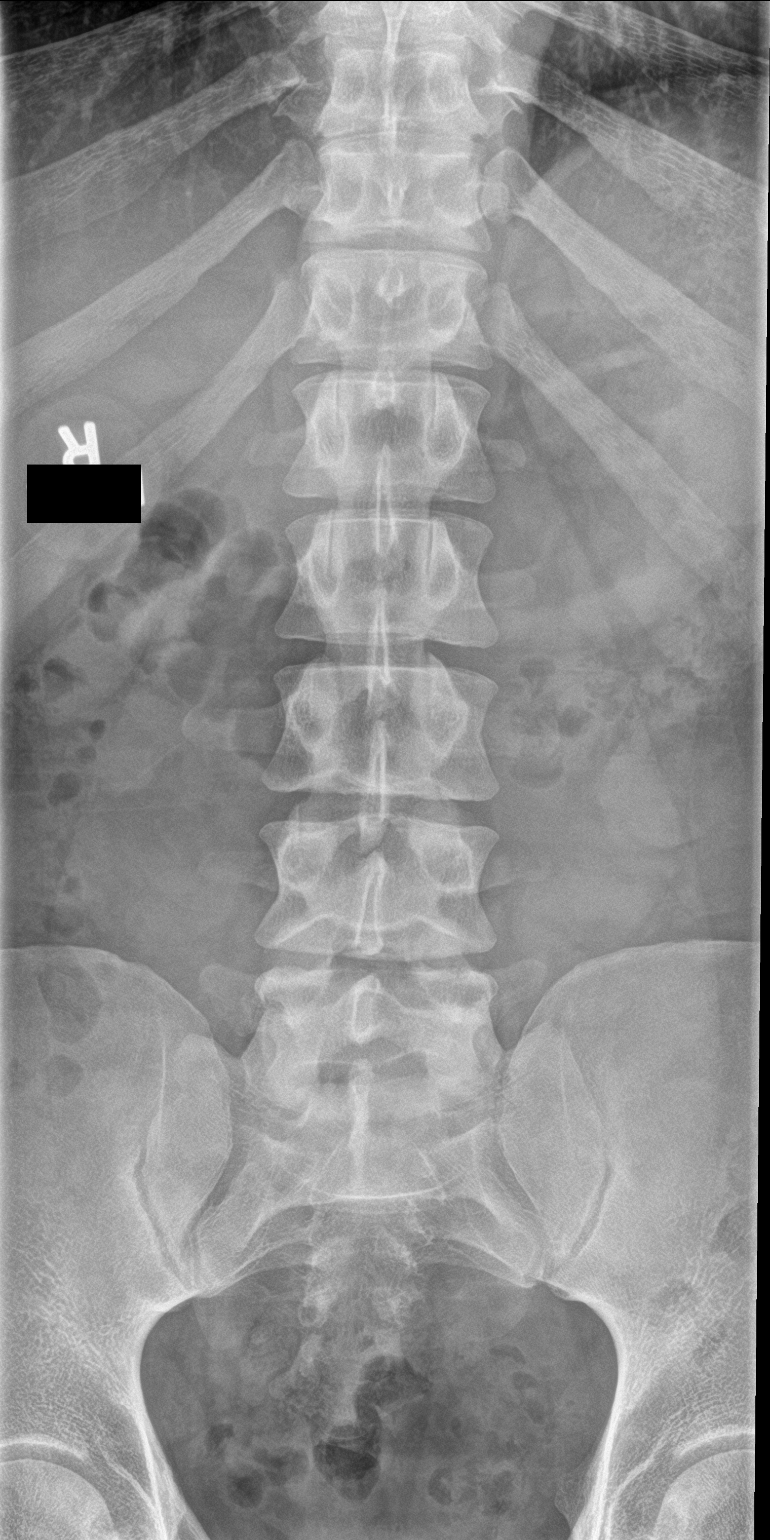

[l-spine lat]
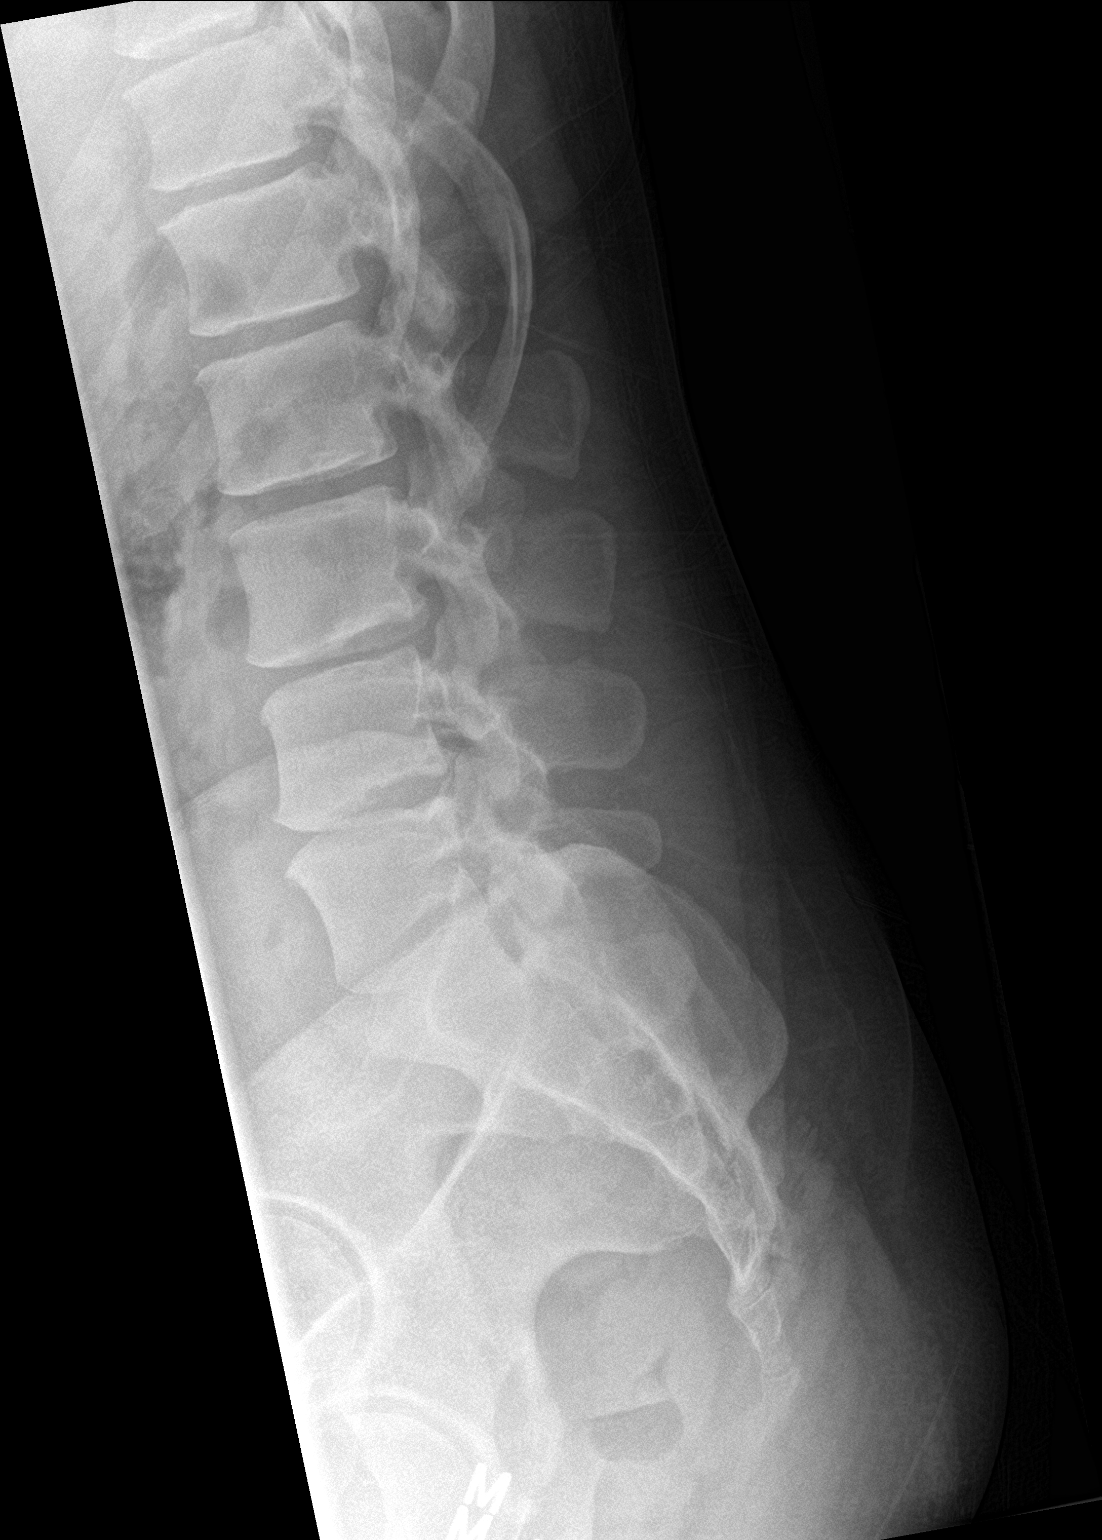

[l-spine spot]
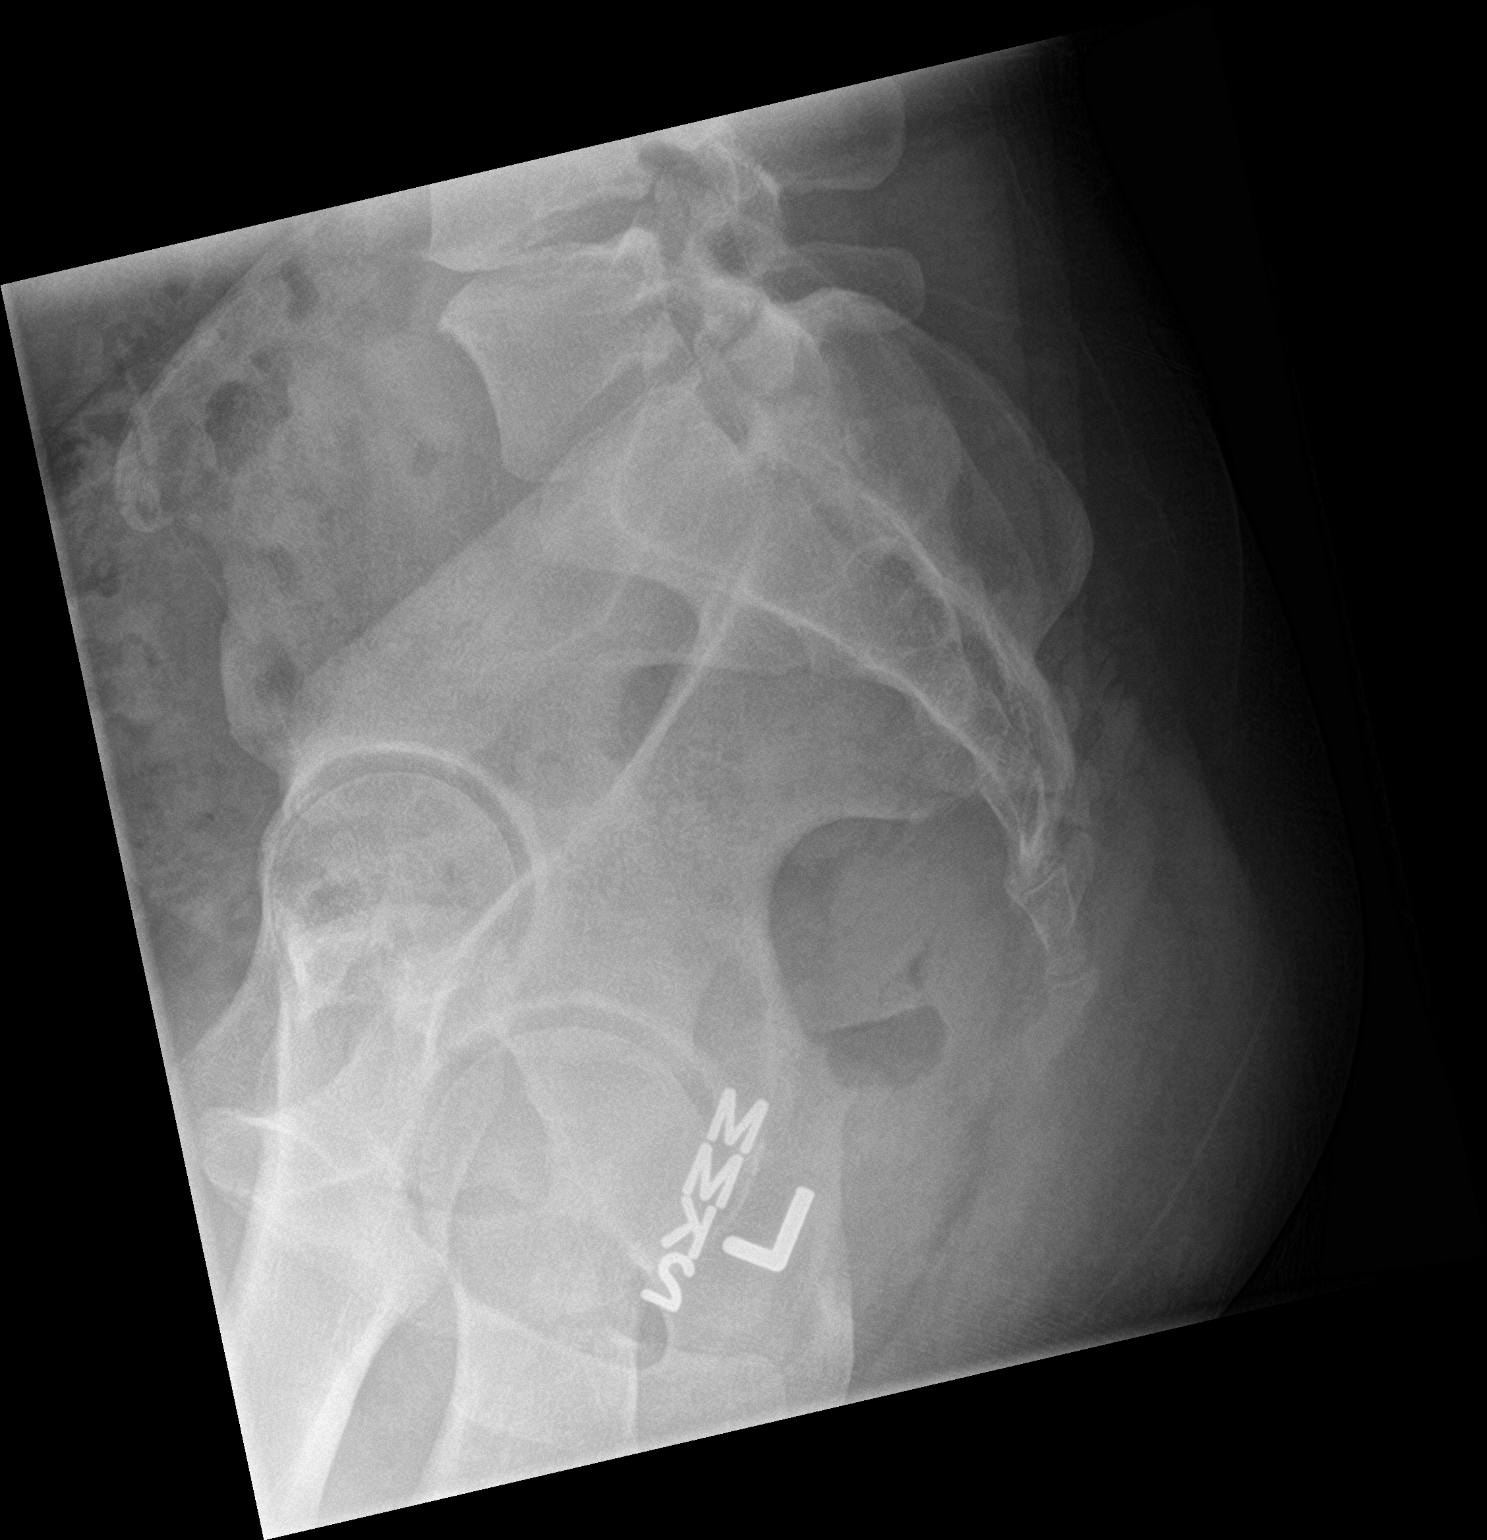

[3 of 3 positions shown; findings below may reference images not displayed]

FINDINGS: Five lumbar type vertebral bodies are well visualized. Vertebral
body height is well maintained. Mild disc space narrowing is seen at
L3-4 and L4-5 stable from the prior exam. No soft tissue abnormality
is noted.
IMPRESSION: Mild degenerative change without acute abnormality.

## 2021-05-29 ENCOUNTER — Emergency Department (HOSPITAL_COMMUNITY): Payer: Self-pay

## 2021-05-29 ENCOUNTER — Emergency Department (HOSPITAL_COMMUNITY)
Admission: EM | Admit: 2021-05-29 | Discharge: 2021-05-29 | Disposition: A | Discharge status: Home / Self Care | Payer: Self-pay | Attending: Emergency Medicine | Admitting: Emergency Medicine

## 2021-05-29 ENCOUNTER — Encounter (HOSPITAL_COMMUNITY): Payer: Self-pay

## 2021-05-29 ENCOUNTER — Other Ambulatory Visit: Payer: Self-pay

## 2021-05-29 DIAGNOSIS — R079 Chest pain, unspecified: Secondary | ICD-10-CM | POA: Insufficient documentation

## 2021-05-29 DIAGNOSIS — Z5321 Procedure and treatment not carried out due to patient leaving prior to being seen by health care provider: Secondary | ICD-10-CM | POA: Insufficient documentation

## 2021-05-29 DIAGNOSIS — I10 Essential (primary) hypertension: Secondary | ICD-10-CM | POA: Insufficient documentation

## 2021-05-29 LAB — CBC WITH DIFFERENTIAL/PLATELET
Abs Immature Granulocytes: 0.02 10*3/uL (ref 0.00–0.07)
Basophils Absolute: 0.1 10*3/uL (ref 0.0–0.1)
Basophils Relative: 1 %
Eosinophils Absolute: 0.2 10*3/uL (ref 0.0–0.5)
Eosinophils Relative: 3 %
HCT: 42 % (ref 39.0–52.0)
Hemoglobin: 14.1 g/dL (ref 13.0–17.0)
Immature Granulocytes: 0 %
Lymphocytes Relative: 37 %
Lymphs Abs: 2.4 10*3/uL (ref 0.7–4.0)
MCH: 27.5 pg (ref 26.0–34.0)
MCHC: 33.6 g/dL (ref 30.0–36.0)
MCV: 81.9 fL (ref 80.0–100.0)
Monocytes Absolute: 0.6 10*3/uL (ref 0.1–1.0)
Monocytes Relative: 8 %
Neutro Abs: 3.4 10*3/uL (ref 1.7–7.7)
Neutrophils Relative %: 51 %
Platelets: 332 10*3/uL (ref 150–400)
RBC: 5.13 MIL/uL (ref 4.22–5.81)
RDW: 12.2 % (ref 11.5–15.5)
WBC: 6.6 10*3/uL (ref 4.0–10.5)
nRBC: 0 % (ref 0.0–0.2)

## 2021-05-29 LAB — BASIC METABOLIC PANEL
Anion gap: 8 (ref 5–15)
BUN: 15 mg/dL (ref 6–20)
CO2: 28 mmol/L (ref 22–32)
Calcium: 9.4 mg/dL (ref 8.9–10.3)
Chloride: 105 mmol/L (ref 98–111)
Creatinine, Ser: 1.27 mg/dL — ABNORMAL HIGH (ref 0.61–1.24)
GFR, Estimated: >60 mL/min (ref >60)
Glucose, Bld: 126 mg/dL — ABNORMAL HIGH (ref 70–99)
Potassium: 3.7 mmol/L (ref 3.5–5.1)
Sodium: 141 mmol/L (ref 135–145)

## 2021-05-29 LAB — TROPONIN I (HIGH SENSITIVITY): Troponin I (High Sensitivity): 11 ng/L (ref <18)

## 2021-05-29 MED ORDER — AMLODIPINE BESYLATE 5 MG PO TABS
5.0000 mg | ORAL_TABLET | Freq: Once | ORAL | Status: AC
  Administered 2021-05-29: 5 mg via ORAL
  Filled 2021-05-29: qty 1

## 2021-05-29 NOTE — ED Notes (Signed)
Per registration, patient turned in his labels and reports he is leaving.

## 2021-05-29 NOTE — ED Triage Notes (Signed)
Patient reports reports he had his physical today and his BP was 170/120. Patient reports that he was told to come to the ED for further evaluation. Patient reports a slight headache.

## 2021-05-29 NOTE — ED Provider Notes (Signed)
Emergency Medicine Provider Triage Evaluation Note  Jack Floyd , a 35 y.o. male  was evaluated in triage.  Pt complains of elevated blood pressures.  Patient was getting an annual physical at the health clinic and they noted his blood pressure was elevated.  Took it 3 times.  No formal diagnosis of hypertension.  Does not take any medications.  No chest pain, shortness of breath, abdominal pain, nausea, vomiting, diarrhea.  Does complain of intermittent slight headache.  Review of Systems  Positive:  Negative: See above   Physical Exam  BP (!) 178/113 (BP Location: Left Arm)   Pulse 81   Temp 98.2 F (36.8 C) (Oral)   Resp 16   Ht 5\' 11"  (1.803 m)   Wt 115.7 kg   SpO2 96%   BMI 35.57 kg/m  Gen:   Awake, no distress   Resp:  Normal effort  MSK:   Moves extremities without difficulty  Other:    Medical Decision Making  Medically screening exam initiated at 3:07 PM.  Appropriate orders placed.  Jack Floyd Burson was informed that the remainder of the evaluation will be completed by another provider, this initial triage assessment does not replace that evaluation, and the importance of remaining in the ED until their evaluation is complete.     Jack Floyd [place], PA-C 05/29/21 1508    14/09/22, MD 05/29/21 [phone]

## 2021-12-02 ENCOUNTER — Ambulatory Visit
Admission: EM | Admit: 2021-12-02 | Discharge: 2021-12-02 | Disposition: A | Discharge status: Home / Self Care | Payer: Self-pay | Attending: Internal Medicine | Admitting: Internal Medicine

## 2021-12-02 DIAGNOSIS — R21 Rash and other nonspecific skin eruption: Secondary | ICD-10-CM

## 2021-12-02 DIAGNOSIS — R202 Paresthesia of skin: Secondary | ICD-10-CM

## 2021-12-02 DIAGNOSIS — I10 Essential (primary) hypertension: Secondary | ICD-10-CM

## 2021-12-02 DIAGNOSIS — R200 Anesthesia of skin: Secondary | ICD-10-CM

## 2021-12-02 MED ORDER — AMLODIPINE BESYLATE 5 MG PO TABS: 5.0000 mg | ORAL_TABLET | Freq: Every day | ORAL | 1 refills | Status: DC

## 2021-12-02 MED ORDER — MICONAZOLE NITRATE 2 % EX POWD: CUTANEOUS | 0 refills | Status: AC | PRN

## 2021-12-02 MED ORDER — CEFDINIR 300 MG PO CAPS: 300.0000 mg | ORAL_CAPSULE | Freq: Two times a day (BID) | ORAL | 0 refills | Status: AC

## 2021-12-02 NOTE — ED Triage Notes (Signed)
Pt presents with numbness & tingling in both arms and legs X 4 days.

## 2021-12-02 NOTE — ED Provider Notes (Signed)
[facility]    CSN: [id number] Arrival date & time: 12/02/21  1305      History   Chief Complaint Chief Complaint  Patient presents with   Numbness   Tingling    HPI Jack Floyd is a 36 y.o. male comes to the urgent care with 4-day history of numbness and tingling of both and forearms as well as legs.  Patient says symptoms started several days ago and has been persistent.  Symptoms are intermittent with no known aggravating or relieving factors.  No changes in dietary habits.  No nausea or vomiting.  No fever or chills.  Patient also complains of itchy perianal rash which has been ongoing for the past couple of months.  Patient is in a heterosexual relationship.  The rash did not improve with steroid cream and other over-the-counter medications.  No constipation.  No diarrhea.  No history of hemorrhoids.  Patient has a history of elevated blood pressure.  He had been seen in the past for elevated blood pressure.  He was prescribed amlodipine was in the hospital but patient left AGAINST MEDICAL ADVICE.  He denies any chest pain or chest pressure.  No abdominal pain or distention.  No headaches.  Patient would like to be started on blood pressure medications.  HPI  Past Medical History:  Diagnosis Date   Asthma     Patient Active Problem List   Diagnosis Date Noted   ENURESIS 08/18/2006   ASTHMA, UNSPECIFIED 08/18/2006   ECZEMA, ATOPIC DERMATITIS 08/18/2006    Past Surgical History:  Procedure Laterality Date   OTHER SURGICAL HISTORY  Facial surgery   SHOULDER SURGERY Bilateral        Home Medications    Prior to Admission medications   Medication Sig Start Date End Date Taking? Authorizing Provider  amLODipine (NORVASC) 5 MG tablet Take 1 tablet (5 mg total) by mouth daily. 12/02/21  Yes Javell Blackburn, Britta Mccreedy, MD  cefdinir (OMNICEF) 300 MG capsule Take 1 capsule (300 mg total) by mouth 2 (two) times daily for 7 days. 12/02/21 12/09/21 Yes Zeyad Delaguila,  Britta Mccreedy, MD  miconazole (MICOTIN) 2 % powder Apply topically as needed for itching. 12/02/21  Yes Ambree Frances, Britta Mccreedy, MD  albuterol (PROVENTIL HFA;VENTOLIN HFA) 108 (90 Base) MCG/ACT inhaler Inhale 1-2 puffs into the lungs every 6 (six) hours as needed for wheezing or shortness of breath. 03/01/17   Domenick Gong, MD  fluticasone (FLONASE) 50 MCG/ACT nasal spray Place 2 sprays into both nostrils daily. 03/01/17   Domenick Gong, MD  predniSONE (STERAPRED UNI-PAK 21 TAB) 10 MG (21) TBPK tablet Dispense one 6 day pack. Take as directed with food. 03/01/17   Domenick Gong, MD  Spacer/Aero-Holding Chambers (AEROCHAMBER PLUS) inhaler Use as instructed 03/01/17   Domenick Gong, MD    Family History Family History  Family history unknown: Yes    Social History Social History   Tobacco Use   Smoking status: Every Day    Packs/day: 0.50    Types: Cigarettes   Smokeless tobacco: Never  Substance Use Topics   Alcohol use: No   Drug use: Yes    Types: Marijuana     Allergies   Penicillins and Red dye   Review of Systems Review of Systems As per HPI  Physical Exam Triage Vital Signs ED Triage Vitals  Enc Vitals Group     BP 12/02/21 1325 (!) 173/126     Pulse Rate 12/02/21 1325 85     Resp 12/02/21  1325 20     Temp 12/02/21 1325 98.1 F (36.7 C)     Temp Source 12/02/21 1325 Oral     SpO2 12/02/21 1325 93 %     Weight --      Height --      Head Circumference --      Peak Flow --      Pain Score 12/02/21 1324 0     Pain Loc --      Pain Edu? --      Excl. in [place]? --    No data found.  Updated Vital Signs BP (!) 157/100 (BP Location: Right Arm)   Pulse 85   Temp 98.1 F (36.7 C) (Oral)   Resp 20   SpO2 93%   Visual Acuity Right Eye Distance:   Left Eye Distance:   Bilateral Distance:    Right Eye Near:   Left Eye Near:    Bilateral Near:     Physical Exam Vitals and nursing note reviewed.  Constitutional:      General: He is not in acute  distress.    Appearance: He is not ill-appearing.  Cardiovascular:     Rate and Rhythm: Normal rate and regular rhythm.     Pulses: Normal pulses.     Heart sounds: Normal heart sounds.  Pulmonary:     Effort: Pulmonary effort is normal.     Breath sounds: Normal breath sounds.  Abdominal:     General: Bowel sounds are normal.     Palpations: Abdomen is soft.  Genitourinary:    Comments: Papular rash in the perianal area.  Some of the rashes are ulcerated.  Extends from the perianal area into the intergluteal cleft.  No significant erythema. Neurological:     Mental Status: He is alert.      UC Treatments / Results  Labs (all labs ordered are listed, but only abnormal results are displayed) Labs Reviewed  CBC WITH DIFFERENTIAL/PLATELET  VITAMIN [id number]  BASIC METABOLIC PANEL  RPR  HIV ANTIBODY (ROUTINE TESTING W REFLEX)    EKG   Radiology No results found.  Procedures Procedures (including critical care time)  Medications Ordered in UC Medications - No data to display  Initial Impression / Assessment and Plan / UC Course  I have reviewed the triage vital signs and the nursing notes.  Pertinent labs & imaging results that were available during my care of the patient were reviewed by me and considered in my medical decision making (see chart for details).     1.  Perianal rash-concern for perianal streptococcal rash or fungal infection Omnicef 300 mg twice daily for 7 days Miconazole powder RPR, HIV Avoid constipation If symptoms worsen please return to urgent care to be reevaluated  2.  Upper extremity/legs numbness and tingling: CBC, vitamin B-12 TSH BMP We will call patient with recommendations if labs are abnormal  3.  Hypertension, uncontrolled: Start amlodipine 5 mg orally daily BMP Patient is advised to be compliant with antihypertensive medication Return precautions given. Final Clinical Impressions(s) / UC Diagnoses   Final diagnoses:   Perianal rash  Numbness and tingling in both hands  Hypertension, uncontrolled     Discharge Instructions      Please take antibiotics as prescribed Use antifungal powder twice daily If you have worsening symptoms please return to urgent care to be reevaluated We will call you with recommendations if labs are abnormal Take antihypertensive medications as prescribed If you have any further concerns please  return to urgent care to be reevaluated.   ED Prescriptions     Medication Sig Dispense Auth. Provider   miconazole (MICOTIN) 2 % powder Apply topically as needed for itching. 70 g Chase Picket, MD   amLODipine (NORVASC) 5 MG tablet Take 1 tablet (5 mg total) by mouth daily. 30 tablet Florence Yeung, Myrene Galas, MD   cefdinir (OMNICEF) 300 MG capsule Take 1 capsule (300 mg total) by mouth 2 (two) times daily for 7 days. 14 capsule Moxon Messler, Myrene Galas, MD      PDMP not reviewed this encounter.   Chase Picket, MD 12/02/21 [phone]

## 2021-12-02 NOTE — Discharge Instructions (Signed)
Please take antibiotics as prescribed Use antifungal powder twice daily If you have worsening symptoms please return to urgent care to be reevaluated We will call you with recommendations if labs are abnormal Take antihypertensive medications as prescribed If you have any further concerns please return to urgent care to be reevaluated.

## 2021-12-03 ENCOUNTER — Encounter (HOSPITAL_COMMUNITY): Payer: Self-pay

## 2021-12-03 LAB — CBC WITH DIFFERENTIAL/PLATELET
Basophils Absolute: 0.1 10*3/uL (ref 0.0–0.2)
Basos: 1 %
EOS (ABSOLUTE): 0.3 10*3/uL (ref 0.0–0.4)
Eos: 4 %
Hematocrit: 44.9 % (ref 37.5–51.0)
Hemoglobin: 15.5 g/dL (ref 13.0–17.7)
Immature Grans (Abs): 0 10*3/uL (ref 0.0–0.1)
Immature Granulocytes: 0 %
Lymphocytes Absolute: 2.1 10*3/uL (ref 0.7–3.1)
Lymphs: 30 %
MCH: 27.8 pg (ref 26.6–33.0)
MCHC: 34.5 g/dL (ref 31.5–35.7)
MCV: 81 fL (ref 79–97)
Monocytes Absolute: 0.5 10*3/uL (ref 0.1–0.9)
Monocytes: 7 %
Neutrophils Absolute: 4 10*3/uL (ref 1.4–7.0)
Neutrophils: 58 %
Platelets: 296 10*3/uL (ref 150–450)
RBC: 5.58 x10E6/uL (ref 4.14–5.80)
RDW: 12.6 % (ref 11.6–15.4)
WBC: 7 10*3/uL (ref 3.4–10.8)

## 2021-12-03 LAB — RPR: RPR Ser Ql: NONREACTIVE

## 2021-12-03 LAB — BASIC METABOLIC PANEL
BUN/Creatinine Ratio: 10 (ref 9–20)
BUN: 13 mg/dL (ref 6–20)
CO2: 22 mmol/L (ref 20–29)
Calcium: 9.7 mg/dL (ref 8.7–10.2)
Chloride: 101 mmol/L (ref 96–106)
Creatinine, Ser: 1.3 mg/dL — ABNORMAL HIGH (ref 0.76–1.27)
Glucose: 138 mg/dL — ABNORMAL HIGH (ref 70–99)
Potassium: 4 mmol/L (ref 3.5–5.2)
Sodium: 140 mmol/L (ref 134–144)
eGFR: 74 mL/min/{1.73_m2} (ref >59)

## 2021-12-03 LAB — VITAMIN B12: Vitamin B-12: 1043 pg/mL (ref 232–1245)

## 2021-12-03 LAB — HIV ANTIBODY (ROUTINE TESTING W REFLEX): HIV Screen 4th Generation wRfx: NONREACTIVE

## 2022-01-07 ENCOUNTER — Telehealth: Payer: Self-pay

## 2022-01-07 NOTE — Telephone Encounter (Signed)
Pt called for lab results from UC visit of 12/02/2021.  Gave results to pt. Pt states that he thought he was being tested for all STI's. Pt's partner is exhibiting s/s of an STI and was treated today. Pt will go to UC for further testing.

## 2022-09-29 ENCOUNTER — Encounter (HOSPITAL_COMMUNITY): Payer: Self-pay

## 2022-09-29 ENCOUNTER — Emergency Department (HOSPITAL_COMMUNITY)
Admission: EM | Admit: 2022-09-29 | Discharge: 2022-09-29 | Disposition: A | Discharge status: Home / Self Care | Payer: Self-pay | Attending: Emergency Medicine | Admitting: Emergency Medicine

## 2022-09-29 DIAGNOSIS — M109 Gout, unspecified: Secondary | ICD-10-CM | POA: Insufficient documentation

## 2022-09-29 DIAGNOSIS — R030 Elevated blood-pressure reading, without diagnosis of hypertension: Secondary | ICD-10-CM | POA: Insufficient documentation

## 2022-09-29 MED ORDER — OXYCODONE HCL 5 MG PO TABS: 5.0000 mg | ORAL_TABLET | ORAL | 0 refills | Status: DC | PRN

## 2022-09-29 MED ORDER — PREDNISONE 20 MG PO TABS: 60.0000 mg | ORAL_TABLET | Freq: Every day | ORAL | 0 refills | Status: DC

## 2022-09-29 MED ORDER — PREDNISONE 20 MG PO TABS
60.0000 mg | ORAL_TABLET | Freq: Once | ORAL | Status: AC
  Administered 2022-09-29: 60 mg via ORAL
  Filled 2022-09-29: qty 3

## 2022-09-29 MED ORDER — NAPROXEN 500 MG PO TABS
500.0000 mg | ORAL_TABLET | Freq: Once | ORAL | Status: AC
  Administered 2022-09-29: 500 mg via ORAL
  Filled 2022-09-29: qty 1

## 2022-09-29 MED ORDER — OXYCODONE-ACETAMINOPHEN 5-325 MG PO TABS
2.0000 | ORAL_TABLET | Freq: Once | ORAL | Status: AC
  Administered 2022-09-29: 2 via ORAL
  Filled 2022-09-29: qty 2

## 2022-09-29 NOTE — Discharge Instructions (Addendum)
Take naproxen or ibuprofen as needed for pain.  For additional pain relief, add acetaminophen.  You may add oxycodone for pain not relieved by the other medications.  Please be aware that all of these medications work well together and the combination works better than any other medications by themselves.  Your blood pressure was very high today.  That might be because of the amount of pain you are in, but it could also be because you actually have high blood pressure.  Please have your blood pressure checked several times over the next 1-2 weeks.  If it is consistently high (higher than 140 for the top number or higher than 90 for the bottom number), then you will need to be on medication to control it.

## 2022-09-29 NOTE — ED Triage Notes (Signed)
Patient arrived with complaints of left sided great toe pain that started today, no known injury. Last dose of Tylenol today at 3pm.

## 2022-09-29 NOTE — ED Provider Notes (Signed)
[facility] AT [facility] Provider Note   CSN: [id number] Arrival date & time: 09/29/22  0148     History  Chief Complaint  Patient presents with   Toe Pain    Jack Floyd is a 37 y.o. male.  The history is provided by the patient.  Toe Pain  He has history of asthma and comes in complaining of pain in his left toe which started this morning.  He denies any trauma.  He has tried taking acetaminophen without relief.   Home Medications Prior to Admission medications   Medication Sig Start Date End Date Taking? Authorizing Provider  albuterol (PROVENTIL HFA;VENTOLIN HFA) 108 (90 Base) MCG/ACT inhaler Inhale 1-2 puffs into the lungs every 6 (six) hours as needed for wheezing or shortness of breath. 03/01/17   Domenick Gong, MD  amLODipine (NORVASC) 5 MG tablet Take 1 tablet (5 mg total) by mouth daily. 12/02/21   Merrilee Jansky, MD  fluticasone (FLONASE) 50 MCG/ACT nasal spray Place 2 sprays into both nostrils daily. 03/01/17   Domenick Gong, MD  miconazole (MICOTIN) 2 % powder Apply topically as needed for itching. 12/02/21   Lamptey, Britta Mccreedy, MD  predniSONE (STERAPRED UNI-PAK 21 TAB) 10 MG (21) TBPK tablet Dispense one 6 day pack. Take as directed with food. 03/01/17   Domenick Gong, MD  Spacer/Aero-Holding Chambers (AEROCHAMBER PLUS) inhaler Use as instructed 03/01/17   Domenick Gong, MD      Allergies    Penicillins and Red dye    Review of Systems   Review of Systems  All other systems reviewed and are negative.   Physical Exam Updated Vital Signs BP (!) 165/139   Pulse 92   Temp 98.6 F (37 C)   Resp 16   SpO2 98%  Physical Exam Vitals and nursing note reviewed.   37 year old male, resting comfortably and in no acute distress. Vital signs are significant for elevated blood pressure. Oxygen saturation is 98%, which is normal. Head is normocephalic and atraumatic. PERRLA, EOMI.  Lungs are clear without  rales, wheezes, or rhonchi. Chest is nontender. Heart has regular rate and rhythm without murmur. Abdomen is soft, flat, nontender. Extremities: There is swelling, erythema, warmth, tenderness of the right first toe centered around the MTP joint consistent with gout. Skin is warm and dry without rash. Neurologic: Mental status is normal, cranial nerves are intact, moves all extremities equally.  ED Results / Procedures / Treatments    Procedures Procedures    Medications Ordered in ED Medications  predniSONE (DELTASONE) tablet 60 mg (has no administration in time range)  naproxen (NAPROSYN) tablet 500 mg (has no administration in time range)  oxyCODONE-acetaminophen (PERCOCET/ROXICET) 5-325 MG per tablet 2 tablet (has no administration in time range)    ED Course/ Medical Decision Making/ A&P                             Medical Decision Making Risk Prescription drug management.   Acute gouty arthritis of the left first toe.  Elevated blood pressure without diagnosis of hypertension.  It is noted that he had amlodipine in his medication list, but he states that he had only taking it intermittently, and has not taken it for a long time.  He does not have a formal diagnosis of hypertension.  I have ordered a dose of prednisone, naproxen, oxycodone-acetaminophen and I am discharging him with prescriptions for prednisone and  oxycodone.  I have advised him to use over-the-counter naproxen or ibuprofen as needed for pain and to add acetaminophen as needed for additional pain relief.  Add oxycodone when combination of NSAID plus acetaminophen does not give adequate relief.  I have recommended blood pressure recheck as outpatient and explained to him the need to treat hypertension if blood pressure is consistently elevated.  He does not have medical insurance, I have given him financial resources for clinics that work with people who are uninsured.  Final Clinical Impression(s) / ED  Diagnoses Final diagnoses:  Gouty arthritis of left great toe  Elevated blood pressure reading without diagnosis of hypertension    Rx / DC Orders ED Discharge Orders          Ordered    predniSONE (DELTASONE) 20 MG tablet  Daily        09/29/22 0234    oxyCODONE (ROXICODONE) 5 MG immediate release tablet  Every 4 hours PRN        09/29/22 0234              Dione Booze, MD 09/29/22 [phone]

## 2023-02-06 ENCOUNTER — Emergency Department (HOSPITAL_COMMUNITY)
Admission: EM | Admit: 2023-02-06 | Discharge: 2023-02-07 | Disposition: A | Discharge status: Home / Self Care | Payer: Medicaid Other | Attending: Emergency Medicine | Admitting: Emergency Medicine

## 2023-02-06 DIAGNOSIS — I10 Essential (primary) hypertension: Secondary | ICD-10-CM | POA: Insufficient documentation

## 2023-02-06 DIAGNOSIS — I160 Hypertensive urgency: Secondary | ICD-10-CM | POA: Diagnosis not present

## 2023-02-06 DIAGNOSIS — R079 Chest pain, unspecified: Secondary | ICD-10-CM

## 2023-02-07 ENCOUNTER — Encounter (HOSPITAL_COMMUNITY): Payer: Self-pay

## 2023-02-07 ENCOUNTER — Emergency Department (HOSPITAL_COMMUNITY): Payer: Medicaid Other

## 2023-02-07 ENCOUNTER — Other Ambulatory Visit: Payer: Self-pay

## 2023-02-07 LAB — RAPID URINE DRUG SCREEN, HOSP PERFORMED
Amphetamines: NOT DETECTED
Barbiturates: NOT DETECTED
Benzodiazepines: NOT DETECTED
Cocaine: NOT DETECTED
Opiates: POSITIVE — AB
Tetrahydrocannabinol: POSITIVE — AB

## 2023-02-07 LAB — TROPONIN I (HIGH SENSITIVITY)
Troponin I (High Sensitivity): 13 ng/L (ref <18)
Troponin I (High Sensitivity): 12 ng/L (ref <18)
Troponin I (High Sensitivity): 13 ng/L (ref <18)

## 2023-02-07 LAB — CBC WITH DIFFERENTIAL/PLATELET
Abs Immature Granulocytes: 0.02 10*3/uL (ref 0.00–0.07)
Basophils Absolute: 0.1 10*3/uL (ref 0.0–0.1)
Basophils Relative: 1 %
Eosinophils Absolute: 0.2 10*3/uL (ref 0.0–0.5)
Eosinophils Relative: 2 %
HCT: 45 % (ref 39.0–52.0)
Hemoglobin: 15.1 g/dL (ref 13.0–17.0)
Immature Granulocytes: 0 %
Lymphocytes Relative: 30 %
Lymphs Abs: 3 10*3/uL (ref 0.7–4.0)
MCH: 27.1 pg (ref 26.0–34.0)
MCHC: 33.6 g/dL (ref 30.0–36.0)
MCV: 80.8 fL (ref 80.0–100.0)
Monocytes Absolute: 0.9 10*3/uL (ref 0.1–1.0)
Monocytes Relative: 9 %
Neutro Abs: 5.7 10*3/uL (ref 1.7–7.7)
Neutrophils Relative %: 58 %
Platelets: 283 10*3/uL (ref 150–400)
RBC: 5.57 MIL/uL (ref 4.22–5.81)
RDW: 12.5 % (ref 11.5–15.5)
WBC: 9.9 10*3/uL (ref 4.0–10.5)
nRBC: 0 % (ref 0.0–0.2)

## 2023-02-07 LAB — COMPREHENSIVE METABOLIC PANEL
ALT: 29 U/L (ref 0–44)
AST: 25 U/L (ref 15–41)
Albumin: 4.6 g/dL (ref 3.5–5.0)
Alkaline Phosphatase: 58 U/L (ref 38–126)
Anion gap: 9 (ref 5–15)
BUN: 15 mg/dL (ref 6–20)
CO2: 26 mmol/L (ref 22–32)
Calcium: 9.4 mg/dL (ref 8.9–10.3)
Chloride: 104 mmol/L (ref 98–111)
Creatinine, Ser: 1.41 mg/dL — ABNORMAL HIGH (ref 0.61–1.24)
GFR, Estimated: >60 mL/min (ref >60)
Glucose, Bld: 85 mg/dL (ref 70–99)
Potassium: 3.6 mmol/L (ref 3.5–5.1)
Sodium: 139 mmol/L (ref 135–145)
Total Bilirubin: 0.6 mg/dL (ref 0.3–1.2)
Total Protein: 7.9 g/dL (ref 6.5–8.1)

## 2023-02-07 LAB — D-DIMER, QUANTITATIVE: D-Dimer, Quant: <0.27 ug{FEU}/mL (ref 0.00–0.50)

## 2023-02-07 MED ORDER — NITROGLYCERIN 0.4 MG SL SUBL
0.4000 mg | SUBLINGUAL_TABLET | Freq: Once | SUBLINGUAL | Status: AC
  Administered 2023-02-07: 0.4 mg via SUBLINGUAL
  Filled 2023-02-07: qty 1

## 2023-02-07 MED ORDER — LABETALOL HCL 5 MG/ML IV SOLN
20.0000 mg | Freq: Once | INTRAVENOUS | Status: AC
  Administered 2023-02-07: 20 mg via INTRAVENOUS
  Filled 2023-02-07: qty 4

## 2023-02-07 MED ORDER — ASPIRIN 81 MG PO CHEW
324.0000 mg | CHEWABLE_TABLET | Freq: Once | ORAL | Status: AC
  Administered 2023-02-07: 324 mg via ORAL
  Filled 2023-02-07: qty 4

## 2023-02-07 MED ORDER — IOHEXOL 350 MG/ML SOLN
100.0000 mL | Freq: Once | INTRAVENOUS | Status: AC | PRN
  Administered 2023-02-07: 100 mL via INTRAVENOUS

## 2023-02-07 MED ORDER — METRONIDAZOLE 500 MG/100ML IV SOLN
500.0000 mg | Freq: Once | INTRAVENOUS | Status: AC
  Administered 2023-02-07: 500 mg via INTRAVENOUS
  Filled 2023-02-07: qty 100

## 2023-02-07 MED ORDER — CEFDINIR 300 MG PO CAPS: 300.0000 mg | ORAL_CAPSULE | Freq: Two times a day (BID) | ORAL | 0 refills | Status: AC

## 2023-02-07 MED ORDER — SODIUM CHLORIDE 0.9 % IV SOLN
1.0000 g | Freq: Once | INTRAVENOUS | Status: AC
  Administered 2023-02-07: 1 g via INTRAVENOUS
  Filled 2023-02-07: qty 10

## 2023-02-07 MED ORDER — KETOROLAC TROMETHAMINE 30 MG/ML IJ SOLN
15.0000 mg | Freq: Once | INTRAMUSCULAR | Status: AC
  Administered 2023-02-07: 15 mg via INTRAVENOUS
  Filled 2023-02-07: qty 1

## 2023-02-07 MED ORDER — MORPHINE SULFATE (PF) 4 MG/ML IV SOLN
4.0000 mg | Freq: Once | INTRAVENOUS | Status: AC
  Administered 2023-02-07: 4 mg via INTRAVENOUS
  Filled 2023-02-07: qty 1

## 2023-02-07 NOTE — ED Notes (Signed)
Patient transported to CT 

## 2023-02-07 NOTE — ED Provider Notes (Signed)
[place] EMERGENCY DEPARTMENT AT [facility] Provider Note   CSN: [id number] Arrival date & time: 02/06/23  2343     History  No chief complaint on file.   Jack Floyd is a 37 y.o. male.  Patient presents with severe central chest pain ongoing since 3 PM.  States pain started while he was moving boxes.  Does wax and wane in severity but not gone away completely.  Pain is worse with taking a deep breath and worse with leaning forward and leaning backwards.  He prefers to sit on the edge of the bed.  States pain started when he was moving boxes in a washing machine.  Pain is in the center of his chest without radiation.  No shortness of breath, nausea, vomiting, cough or fever.  Denies any cardiac history.  Never had this kind of pain before.  Very hypertensive on arrival.  States he does have a history of hypertension and takes his medication is compliant but does not know what he takes. Denies any cocaine or other drug abuse.  Denies alcohol abuse. Pain starts in the center of his chest without radiation to his back, neck, arms or legs.  No shortness of breath, cough or fever.  Patient states he has not taken his blood pressure medication for about 3 weeks before taking 2 pills tonight for the first time.  He is not sure what medication it is.  Chart review shows most likely amlodipine.  He took it to lower his blood pressure to have his teeth removed and then stopped taking it after that.  He reports pain started while he was trying to manipulate a washer on a dolly.  Believes he might of pulled something.  Does not have any exertional chest pain.  The history is provided by the patient.       Home Medications Prior to Admission medications   Medication Sig Start Date End Date Taking? Authorizing Provider  albuterol (PROVENTIL HFA;VENTOLIN HFA) 108 (90 Base) MCG/ACT inhaler Inhale 1-2 puffs into the lungs every 6 (six) hours as needed for wheezing or shortness  of breath. 03/01/17   Domenick Gong, MD  fluticasone (FLONASE) 50 MCG/ACT nasal spray Place 2 sprays into both nostrils daily. 03/01/17   Domenick Gong, MD  miconazole (MICOTIN) 2 % powder Apply topically as needed for itching. 12/02/21   Lamptey, Britta Mccreedy, MD  oxyCODONE (ROXICODONE) 5 MG immediate release tablet Take 1 tablet (5 mg total) by mouth every 4 (four) hours as needed for severe pain. 09/29/22   Dione Booze, MD  predniSONE (DELTASONE) 20 MG tablet Take 3 tablets (60 mg total) by mouth daily. 09/29/22   Dione Booze, MD  Spacer/Aero-Holding Chambers (AEROCHAMBER PLUS) inhaler Use as instructed 03/01/17   Domenick Gong, MD      Allergies    Penicillins and Red dye #40 (allura red)    Review of Systems   Review of Systems  Constitutional:  Negative for activity change, appetite change and fever.  HENT:  Negative for congestion and rhinorrhea.   Respiratory:  Positive for chest tightness. Negative for shortness of breath.   Cardiovascular:  Positive for chest pain.  Gastrointestinal:  Negative for anal bleeding, nausea and vomiting.  Genitourinary:  Negative for dysuria and hematuria.  Musculoskeletal:  Negative for arthralgias, back pain and myalgias.  Skin:  Negative for rash.  Neurological:  Negative for dizziness, weakness and headaches.   all other systems are negative except as noted in the HPI  and PMH.    Physical Exam Updated Vital Signs BP (!) 216/146 (BP Location: Right Arm)   Pulse 93   Temp 99.5 F (37.5 C) (Oral)   Resp 10   SpO2 96%  Physical Exam Vitals and nursing note reviewed.  Constitutional:      General: He is not in acute distress.    Appearance: He is well-developed.     Comments: Uncomfortable, tearful  HENT:     Head: Normocephalic and atraumatic.     Mouth/Throat:     Pharynx: No oropharyngeal exudate.  Eyes:     Conjunctiva/sclera: Conjunctivae normal.     Pupils: Pupils are equal, round, and reactive to light.  Neck:      Comments: No meningismus. Cardiovascular:     Rate and Rhythm: Normal rate and regular rhythm.     Heart sounds: Normal heart sounds. No murmur heard. Pulmonary:     Effort: Pulmonary effort is normal. No respiratory distress.     Breath sounds: Normal breath sounds.     Comments: Chest pain not reproducible  Equal upper extremity grip strength and radial pulses. Chest:     Chest wall: No tenderness.  Abdominal:     Palpations: Abdomen is soft.     Tenderness: There is no abdominal tenderness. There is no guarding or rebound.  Musculoskeletal:        General: No tenderness. Normal range of motion.     Cervical back: Normal range of motion and neck supple.  Skin:    General: Skin is warm.  Neurological:     General: No focal deficit present.     Mental Status: He is alert and oriented to person, place, and time. Mental status is at baseline.     Cranial Nerves: No cranial nerve deficit.     Motor: No abnormal muscle tone.     Coordination: Coordination normal.     Comments: No ataxia on finger to nose bilaterally. No pronator drift. 5/5 strength throughout. CN 2-12 intact.Equal grip strength. Sensation intact.   Psychiatric:        Behavior: Behavior normal.     ED Results / Procedures / Treatments   Labs (all labs ordered are listed, but only abnormal results are displayed) Labs Reviewed  COMPREHENSIVE METABOLIC PANEL - Abnormal; Notable for the following components:      Result Value   Creatinine, Ser 1.41 (*)    All other components within normal limits  RAPID URINE DRUG SCREEN, HOSP PERFORMED - Abnormal; Notable for the following components:   Opiates POSITIVE (*)    Tetrahydrocannabinol POSITIVE (*)    All other components within normal limits  CBC WITH DIFFERENTIAL/PLATELET  D-DIMER, QUANTITATIVE  TROPONIN I (HIGH SENSITIVITY)  TROPONIN I (HIGH SENSITIVITY)  TROPONIN I (HIGH SENSITIVITY)  TROPONIN I (HIGH SENSITIVITY)  TROPONIN I (HIGH SENSITIVITY)     EKG EKG Interpretation Date/Time:  Monday February 07 2023 00:04:03 EDT Ventricular Rate:  87 PR Interval:  167 QRS Duration:  81 QT Interval:  337 QTC Calculation: 406 R Axis:   83  Text Interpretation: Sinus rhythm Anterior infarct, old Minimal ST elevation, inferior leads No significant change was found Confirmed by Glynn Octave [phone]) on 02/07/2023 12:33:28 AM  Radiology CT Angio Chest/Abd/Pel for Dissection W and/or Wo Contrast  Result Date: 02/07/2023 CLINICAL DATA:  Unable to take a deep breath without feeling like someone is stabbing him with a screwdriver. The pain started while lifting a washing machine. Acute aortic syndrome suspected. EXAM:  CT ANGIOGRAPHY CHEST, ABDOMEN AND PELVIS TECHNIQUE: Non-contrast CT of the chest was initially obtained. Multidetector CT imaging through the chest, abdomen and pelvis was performed using the standard protocol during bolus administration of intravenous contrast. Multiplanar reconstructed images and MIPs were obtained and reviewed to evaluate the vascular anatomy. RADIATION DOSE REDUCTION: This exam was performed according to the departmental dose-optimization program which includes automated exposure control, adjustment of the mA and/or kV according to patient size and/or use of iterative reconstruction technique. CONTRAST:  OMNIPAQUE IOHEXOL 350 MG/ML SOLN COMPARISON:  Chest radiograph 02/07/2023 and report from CT 12/05/2003 FINDINGS: CTA CHEST FINDINGS Cardiovascular: Femoral heart size. No pericardial effusion. Normal caliber thoracic aorta without dissection or intramural hematoma. Mediastinum/Nodes: Trachea and esophagus are unremarkable. No thoracic adenopathy. Lungs/Pleura: Mild bronchial wall thickening in the lower lobes. Patchy ground-glass opacities in the left lower lobe with associated centrilobular micronodularity and budding tree nodularity. Layering secretions in the bilateral mainstem bronchi. No pleural effusion or  pneumothorax. Musculoskeletal: No acute fracture. Review of the MIP images confirms the above findings. CTA ABDOMEN AND PELVIS FINDINGS VASCULAR No aortic aneurysm or dissection. The mesenteric and renal arteries are widely patent. Widely patent inflow and outflow arteries bilaterally. Review of the MIP images confirms the above findings. NON-VASCULAR Hepatobiliary: Unremarkable liver. Normal gallbladder. No biliary dilation. Pancreas: Unremarkable. Spleen: Unremarkable. Adrenals/Urinary Tract: Normal adrenal glands. No urinary calculi or hydronephrosis. Bladder is unremarkable. Stomach/Bowel: Normal caliber large and small bowel. No bowel wall thickening. The appendix is normal.Stomach is within normal limits. Lymphatic: No enlarged abdominal or pelvic lymph nodes. Reproductive: Unremarkable. Other: No free intraperitoneal fluid or air. Musculoskeletal: No acute fracture. IMPRESSION: 1. No evidence of aortic dissection or aneurysm. 2. Bronchiolitis/small airway infection greatest in the left lower lobe. 3. Layering secretions in the bilateral mainstem bronchi. Query aspiration. Electronically Signed   By: Minerva Fester M.D.   On: 02/07/2023 03:28   DG Chest 2 View  Result Date: 02/07/2023 CLINICAL DATA:  Chest pain EXAM: CHEST - 2 VIEW COMPARISON:  05/29/2021 FINDINGS: The heart size and mediastinal contours are within normal limits. Both lungs are clear. Apparent relative opacity of the lungs likely relates to overlying soft tissue and mild underpenetration. The visualized skeletal structures are unremarkable. IMPRESSION: No active cardiopulmonary disease. Electronically Signed   By: Helyn Numbers M.D.   On: 02/07/2023 00:20    Procedures Procedures    Medications Ordered in ED Medications  morphine (PF) 4 MG/ML injection 4 mg (has no administration in time range)  aspirin chewable tablet 324 mg (has no administration in time range)  labetalol (NORMODYNE) injection 20 mg (has no administration in  time range)    ED Course/ Medical Decision Making/ A&P                                 Medical Decision Making Amount and/or Complexity of Data Reviewed Labs: ordered. Decision-making details documented in ED Course. Radiology: ordered and independent interpretation performed. Decision-making details documented in ED Course. ECG/medicine tests: ordered and independent interpretation performed. Decision-making details documented in ED Course.  Risk OTC drugs. Prescription drug management.   Severe central chest pain since approximately 3 PM worse with movement and taking a deep breath.  He is tearful, diaphoretic and hypertensive.  EKG shows sinus rhythm without acute ST changes.  Chest x-ray shows no pneumothorax or widened mediastinum.  Results reviewed and interpreted by me.  Initial troponin is  negative, D-dimer is negative.  With patient's severe hypertension, central chest pain there is concern for aortic aneurysm and/or dissection.  CTA will be obtained. Dose of IV labetalol and IV morphine given.  Patient given IV labetalol for hypertensive urgency.  Troponin is negative x 2.  D-dimer is negative.  Low suspicion for pulmonary embolism or ACS.  CTA is obtained to evaluate for aortic aneurysm or dissection.  This is negative.  No evidence of aortic dissection or aneurysm.  No other acute pathology.  Results reviewed and interpreted by me.  Does show evidence of bronchiolitis and possible aspiration Patient is penicillin allergic.  Discussed with pharmacy Marcelino Duster.  Recommend cephalosporin plus or minus Flagyl or Levaquin.  Troponin Remains negative.  Blood pressure has improved to 130 systolic.  Discussed with cardiology Dr. Evette Doffing.  She reviewed EKGs.  Agrees low suspicion for ACS.  Troponin remains negative.  Does have T wave inversion in V6 repeat EKG with this is nonspecific.  No chest pain.  Would not describe his pain is exertional.  Pain started while manipulating washing  machine on a dolly.  Question sprain or strain of chest.  Pain has resolved. Troponin flat and negative x3.  UDS positive for THC.  Negative for cocaine.  Discussed compliance with his medication for blood pressure.  Discussed follow-up with cardiology for suspected stress test. Return to the ED with exertional chest pain, pain associate with shortness of breath, nausea, vomiting, sweating or other concerns.       Final Clinical Impression(s) / ED Diagnoses Final diagnoses:  Chest pain, unspecified type  Hypertensive urgency    Rx / DC Orders ED Discharge Orders     None         Onaje Warne, Jeannett Senior, MD 02/07/23 [phone]

## 2023-02-07 NOTE — ED Notes (Signed)
Blue tube, red tube and dark green tubes sent to lab

## 2023-02-07 NOTE — ED Triage Notes (Signed)
Pt. Arrives POV c/o CP x3 hours. Pt states that he is unable to take a deep breath without it feeling like someone is stabbing him with a screw driver. He states that when he started to feel the pain, he was lifting a washing machine. Denies radiation of pain, NV.

## 2023-02-07 NOTE — ED Notes (Signed)
Gave pt apple juice, graham crackers and saltines

## 2023-02-07 NOTE — ED Notes (Signed)
ED Provider at bedside. 

## 2023-02-07 NOTE — Discharge Instructions (Addendum)
Your testing is reassuring.  No evidence of heart attack or blood clot in the lung.  As we discussed you are low risk for heart disease but nonzero risk.  It is important to take your blood pressure medication as prescribed.  Follow-up with the cardiologist for a stress test.  Return to the ED with exertional chest pain, pain associate with shortness of breath, nausea, vomiting, sweating or other concerns.

## 2023-07-16 ENCOUNTER — Emergency Department (HOSPITAL_COMMUNITY)
Admission: EM | Admit: 2023-07-16 | Discharge: 2023-07-16 | Disposition: A | Discharge status: Home / Self Care | Payer: Medicaid Other | Attending: Emergency Medicine | Admitting: Emergency Medicine

## 2023-07-16 ENCOUNTER — Encounter (HOSPITAL_COMMUNITY): Payer: Self-pay

## 2023-07-16 ENCOUNTER — Other Ambulatory Visit: Payer: Self-pay

## 2023-07-16 DIAGNOSIS — M10072 Idiopathic gout, left ankle and foot: Secondary | ICD-10-CM | POA: Insufficient documentation

## 2023-07-16 DIAGNOSIS — M79675 Pain in left toe(s): Secondary | ICD-10-CM | POA: Diagnosis present

## 2023-07-16 MED ORDER — IBUPROFEN 800 MG PO TABS: 800.0000 mg | ORAL_TABLET | Freq: Three times a day (TID) | ORAL | 0 refills | Status: AC | PRN

## 2023-07-16 MED ORDER — IBUPROFEN 800 MG PO TABS: 800.0000 mg | ORAL_TABLET | Freq: Three times a day (TID) | ORAL | 0 refills | Status: DC | PRN

## 2023-07-16 MED ORDER — IBUPROFEN 800 MG PO TABS
800.0000 mg | ORAL_TABLET | Freq: Once | ORAL | Status: AC
  Administered 2023-07-16: 800 mg via ORAL
  Filled 2023-07-16: qty 1

## 2023-07-16 MED ORDER — PREDNISONE 10 MG PO TABS: ORAL_TABLET | ORAL | 0 refills | Status: AC

## 2023-07-16 MED ORDER — PREDNISONE 10 MG PO TABS: ORAL_TABLET | ORAL | 0 refills | Status: DC

## 2023-07-16 MED ORDER — PREDNISONE 20 MG PO TABS
40.0000 mg | ORAL_TABLET | Freq: Once | ORAL | Status: AC
  Administered 2023-07-16: 40 mg via ORAL
  Filled 2023-07-16: qty 2

## 2023-07-16 NOTE — ED Provider Notes (Signed)
EMERGENCY DEPARTMENT AT [facility] Provider Note   CSN: [id number] Arrival date & time: 07/16/23  1319     History  Chief Complaint  Patient presents with   Gout    Jack Floyd is a 38 y.o. male with a history of gout presents with concern for pain in his left great toe that started last night.  Denies any injuries to the area or wounds of the foot or leg.  Denies any fever or chills.  He reports this feels the same as his previous gout flare but was out of his gout medication he takes at home.  HPI     Home Medications Prior to Admission medications   Medication Sig Start Date End Date Taking? Authorizing Provider  ibuprofen (ADVIL) 800 MG tablet Take 1 tablet (800 mg total) by mouth every 8 (eight) hours as needed for moderate pain (pain score 4-6). 07/16/23  Yes Arabella Merles, PA-C  predniSONE (DELTASONE) 10 MG tablet Take 4 tablets (40 mg total) by mouth daily with breakfast for 1 day, THEN 3 tablets (30 mg total) daily with breakfast for 2 days, THEN 2 tablets (20 mg total) daily with breakfast for 2 days, THEN 1 tablet (10 mg total) daily with breakfast for 1 day. 07/16/23 07/22/23 Yes Arabella Merles, PA-C  albuterol (PROVENTIL HFA;VENTOLIN HFA) 108 (90 Base) MCG/ACT inhaler Inhale 1-2 puffs into the lungs every 6 (six) hours as needed for wheezing or shortness of breath. Patient not taking: Reported on 02/07/2023 03/01/17   Domenick Gong, MD  amLODipine (NORVASC) 5 MG tablet Take 5 mg by mouth daily. 12/03/22   [provider]  cefdinir (OMNICEF) 300 MG capsule Take 1 capsule (300 mg total) by mouth 2 (two) times daily. 02/07/23   Rancour, Jeannett Senior, MD  fluticasone (FLONASE) 50 MCG/ACT nasal spray Place 2 sprays into both nostrils daily. Patient not taking: Reported on 02/07/2023 03/01/17   Domenick Gong, MD  miconazole (MICOTIN) 2 % powder Apply topically as needed for itching. Patient not taking: Reported on 02/07/2023 12/02/21    Merrilee Jansky, MD  Spacer/Aero-Holding Chambers (AEROCHAMBER PLUS) inhaler Use as instructed 03/01/17   Domenick Gong, MD      Allergies    Penicillins and Red dye #40 (allura red)    Review of Systems   Review of Systems  Constitutional:  Negative for fever.  Musculoskeletal:        Swelling and pain of left great toe    Physical Exam Updated Vital Signs BP (!) 158/129   Pulse 93   Temp 98.6 F (37 C) (Oral)   Resp 18   Ht 5\' 11"  (1.803 m)   Wt 99.8 kg   SpO2 97%   BMI 30.68 kg/m  Physical Exam Vitals and nursing note reviewed.  Constitutional:      Appearance: Normal appearance.  HENT:     Head: Atraumatic.  Cardiovascular:     Rate and Rhythm: Normal rate and regular rhythm.  Pulmonary:     Effort: Pulmonary effort is normal.  Musculoskeletal:     Comments: Edema but no erythema of the left great toe that also extends up to the base of the 1st metatarsal.  Pain with palpation of the first MTP joint.  Pain movement of the left great toe.  Full range of motion of the 2nd through 5th digits of the left foot, full range of motion of the left ankle  Skin:    Comments: No open wounds or  abrasions of the left lower extremity.  No areas of fluctuance.  Neurological:     General: No focal deficit present.     Mental Status: He is alert.  Psychiatric:        Mood and Affect: Mood normal.        Behavior: Behavior normal.     ED Results / Procedures / Treatments   Labs (all labs ordered are listed, but only abnormal results are displayed) Labs Reviewed - No data to display  EKG None  Radiology No results found.  Procedures Procedures    Medications Ordered in ED Medications  predniSONE (DELTASONE) tablet 40 mg (has no administration in time range)  ibuprofen (ADVIL) tablet 800 mg (has no administration in time range)    ED Course/ Medical Decision Making/ A&P                                 Medical Decision Making Risk Prescription drug  management.     Differential diagnosis includes but is not limited to gout, cellulitis, septic arthritis  ED Course:  Patient well-appearing aside from a elevated blood pressure upon arrival at 158/129.  He has edema and significant tenderness to palpation of the left great toe MTP joint.  Pain with movement of the left great toe MTP.  No overlying erythema, wounds, a febrile, no tachycardia, patient states this feels similar to his previous gout flare.  No concern for septic arthritis at this time.  Denies any injuries to his foot, no indication for imaging at this time. Patient was given 40 mg prednisone and 800 mg ibuprofen here for pain.  He repeatedly asked for oxycodone which I explained is not indicated for treatment of gout pain.  Patient stable and appropriate for discharge home at this time  Impression: Gout of left great toe  Disposition:  The patient was discharged home with instructions to take prednisone taper as prescribed.  May take 800 mg ibuprofen every 8 hours for pain.  Discontinue ibuprofen as soon as pain improves, do not take for longer than a week.  Follow-up with PCP within the next month for blood pressure management and discussion of possible preventative strategies for gout. Return precautions given.               Final Clinical Impression(s) / ED Diagnoses Final diagnoses:  Acute idiopathic gout involving toe of left foot    Rx / DC Orders ED Discharge Orders          Ordered    predniSONE (DELTASONE) 10 MG tablet  Q breakfast        07/16/23 1626    ibuprofen (ADVIL) 800 MG tablet  Every 8 hours PRN        07/16/23 1626              Arabella Merles, PA-C 07/16/23 1630    Laurence Spates, MD 07/17/23 1251

## 2023-07-16 NOTE — Discharge Instructions (Addendum)
You likely have a gout flare.  Take 800mg  ibuprofen every 8 hours as needed for pain.  You were given your first dose here at 4 PM.  Your next dose can be no sooner than midnight tonight.  Do not exceed 2.4g of ibuprofen per day.  Typically, pain should start to resolve within about 7-10 days. Discontinue taking this medication when your pain has resolved.  You have been prescribed prednisone.  You were given your first dose here today.  Please take this medication as prescribed beginning tomorrow morning for the next 6 days   Maintaining a healthy weight and healthy blood pressure can help reduce the risk of flares. Limit consumption of alcohol, sweetened beverages (like sodas and sweet tea), red meats, organ meats (liver), and shellfish. Consumption of these foods can trigger flares.   If you have frequent flares, please schedule an appointment to talk to your PCP. They will be able to recommend further strategies for prevention of gout flare ups, which may include preventative medications.  Your blood pressure was also elevated today.  Please continue taking your home amlodipine as prescribed.  Please follow-up with your PCP within the next month for further blood pressure management.  Return to the ER if your symptoms do not start to improve within the next 48 hours, you develop fever or chills, any other new or concerning symptoms

## 2023-07-16 NOTE — ED Triage Notes (Signed)
Pt has gout in left toe, states he takes medicine everyday and has run out so it made his gout flare up.
